# Patient Record
Sex: Male | Born: 1995
Health system: Southern US, Community
[De-identification: ages and names within clinical notes are randomized; demographics above are authoritative.]

## PROBLEM LIST (undated history)

## (undated) DIAGNOSIS — I319 Disease of pericardium, unspecified: Secondary | ICD-10-CM

## (undated) DIAGNOSIS — K219 Gastro-esophageal reflux disease without esophagitis: Secondary | ICD-10-CM

## (undated) DIAGNOSIS — R7989 Other specified abnormal findings of blood chemistry: Secondary | ICD-10-CM

## (undated) DIAGNOSIS — R945 Abnormal results of liver function studies: Secondary | ICD-10-CM

## (undated) HISTORY — DX: Gastro-esophageal reflux disease without esophagitis: K21.9

## (undated) HISTORY — PX: NO PAST SURGERIES: SHX2092

---
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
## 2019-10-25 ENCOUNTER — Encounter (HOSPITAL_COMMUNITY): Payer: Self-pay

## 2019-10-25 ENCOUNTER — Other Ambulatory Visit: Payer: Self-pay

## 2019-10-25 ENCOUNTER — Emergency Department (HOSPITAL_COMMUNITY)
Admission: EM | Admit: 2019-10-25 | Discharge: 2019-10-25 | Disposition: A | Discharge status: Home / Self Care | Payer: Self-pay | Attending: Emergency Medicine | Admitting: Emergency Medicine

## 2019-10-25 ENCOUNTER — Emergency Department (HOSPITAL_COMMUNITY): Payer: Self-pay

## 2019-10-25 DIAGNOSIS — R079 Chest pain, unspecified: Secondary | ICD-10-CM | POA: Insufficient documentation

## 2019-10-25 DIAGNOSIS — Z20822 Contact with and (suspected) exposure to covid-19: Secondary | ICD-10-CM | POA: Insufficient documentation

## 2019-10-25 DIAGNOSIS — R509 Fever, unspecified: Secondary | ICD-10-CM | POA: Insufficient documentation

## 2019-10-25 DIAGNOSIS — Z87891 Personal history of nicotine dependence: Secondary | ICD-10-CM | POA: Insufficient documentation

## 2019-10-25 DIAGNOSIS — B349 Viral infection, unspecified: Secondary | ICD-10-CM | POA: Insufficient documentation

## 2019-10-25 LAB — BASIC METABOLIC PANEL
Anion gap: 15 (ref 5–15)
BUN: 12 mg/dL (ref 6–20)
CO2: 24 mmol/L (ref 22–32)
Calcium: 10 mg/dL (ref 8.9–10.3)
Chloride: 96 mmol/L — ABNORMAL LOW (ref 98–111)
Creatinine, Ser: 0.98 mg/dL (ref 0.61–1.24)
GFR calc Af Amer: >60 mL/min (ref >60)
GFR calc non Af Amer: >60 mL/min (ref >60)
Glucose, Bld: 108 mg/dL — ABNORMAL HIGH (ref 70–99)
Potassium: 3.9 mmol/L (ref 3.5–5.1)
Sodium: 135 mmol/L (ref 135–145)

## 2019-10-25 LAB — CBC
HCT: 41.7 % (ref 39.0–52.0)
Hemoglobin: 14.2 g/dL (ref 13.0–17.0)
MCH: 27.9 pg (ref 26.0–34.0)
MCHC: 34.1 g/dL (ref 30.0–36.0)
MCV: 81.9 fL (ref 80.0–100.0)
Platelets: 160 10*3/uL (ref 150–400)
RBC: 5.09 MIL/uL (ref 4.22–5.81)
RDW: 12 % (ref 11.5–15.5)
WBC: 6.3 10*3/uL (ref 4.0–10.5)
nRBC: 0 % (ref 0.0–0.2)

## 2019-10-25 LAB — TROPONIN I (HIGH SENSITIVITY): Troponin I (High Sensitivity): 3 ng/L (ref <18)

## 2019-10-25 LAB — SARS CORONAVIRUS 2 BY RT PCR (HOSPITAL ORDER, PERFORMED IN ~~LOC~~ HOSPITAL LAB): SARS Coronavirus 2: NEGATIVE

## 2019-10-25 IMAGING — CR DG CHEST 2V
2 series · 2 of 2 positions shown · non-contrast
Comparison: None.

CLINICAL DATA: Chest pain for 2 months.  Fever.

EXAM:
CHEST - 2 VIEW

[w chest pa]
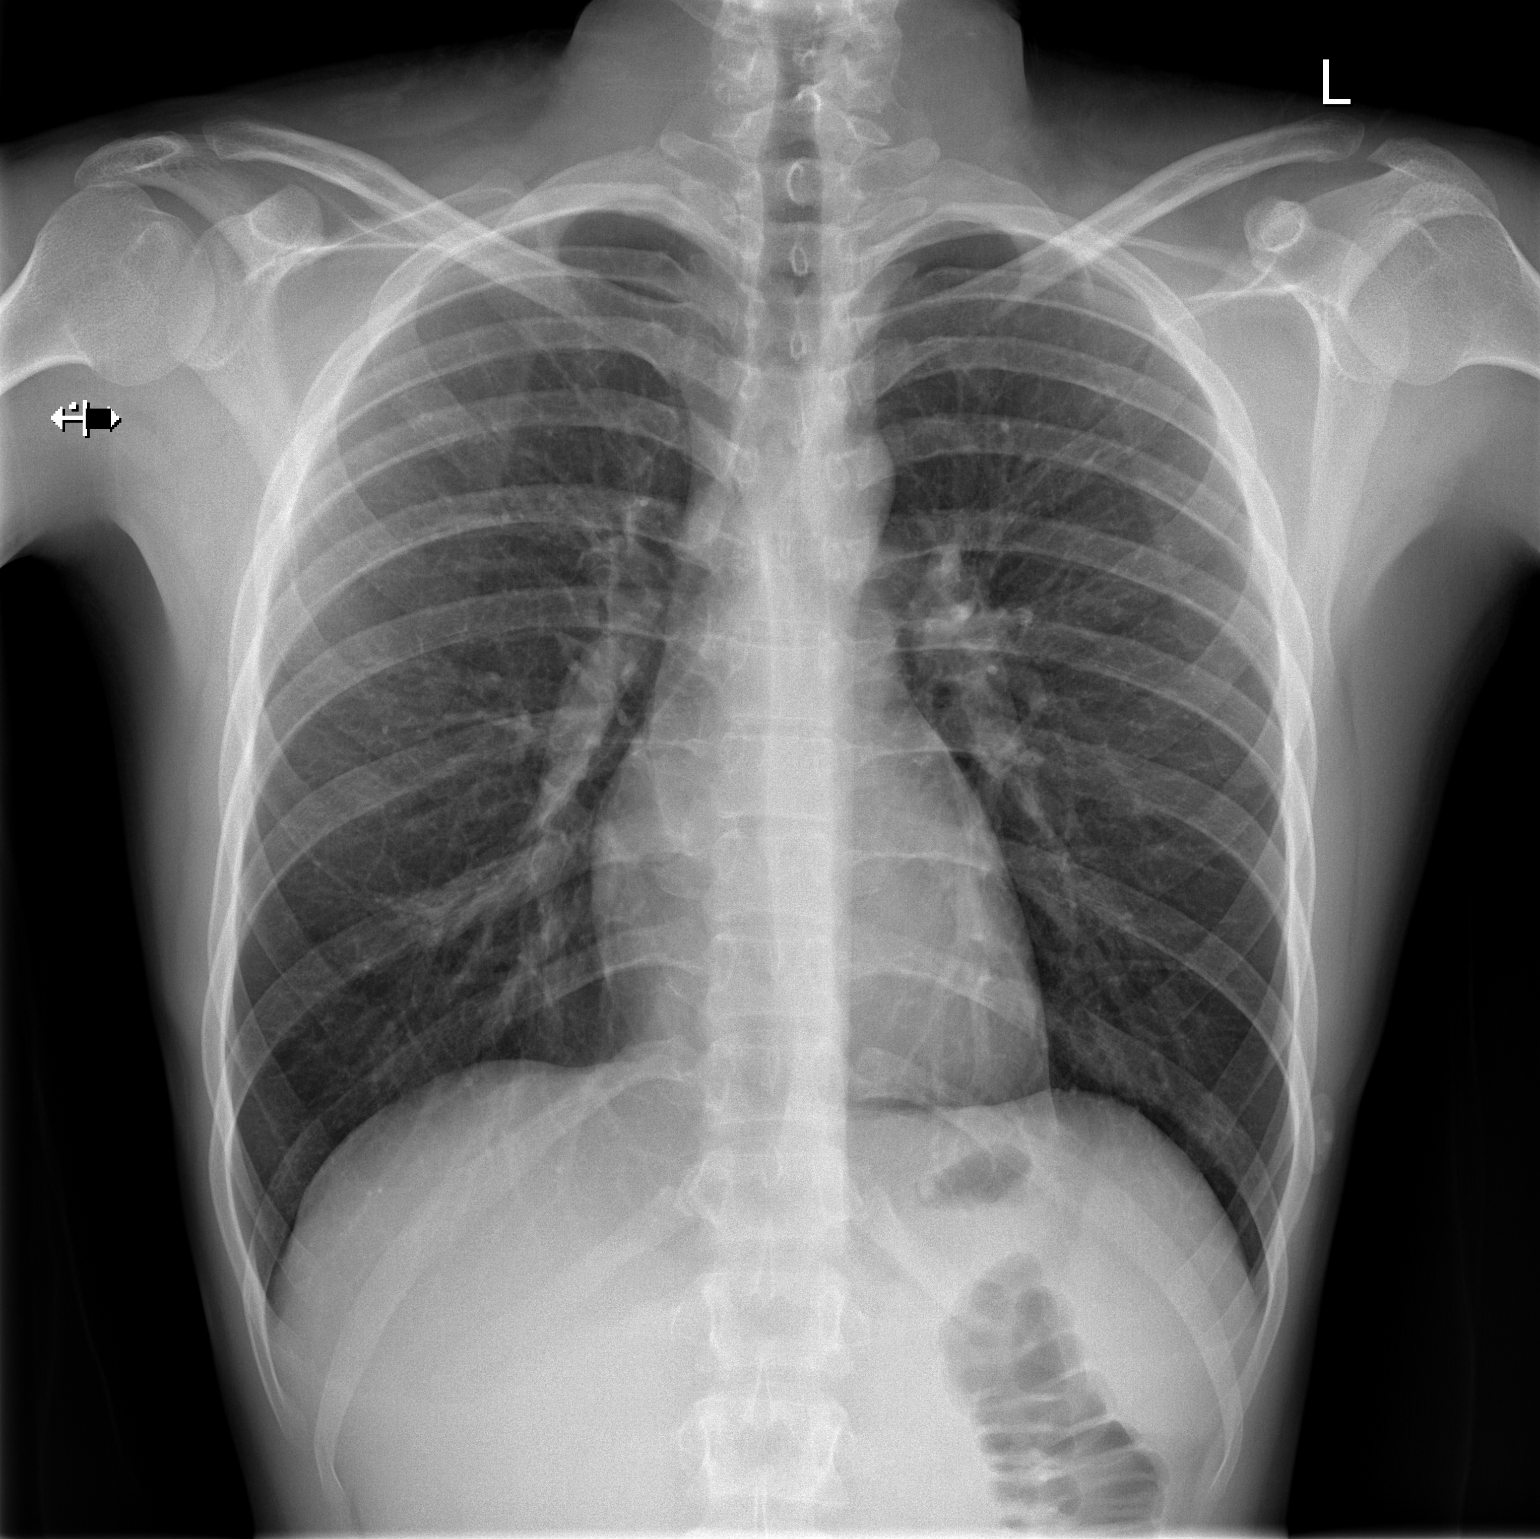

[w chest lat]
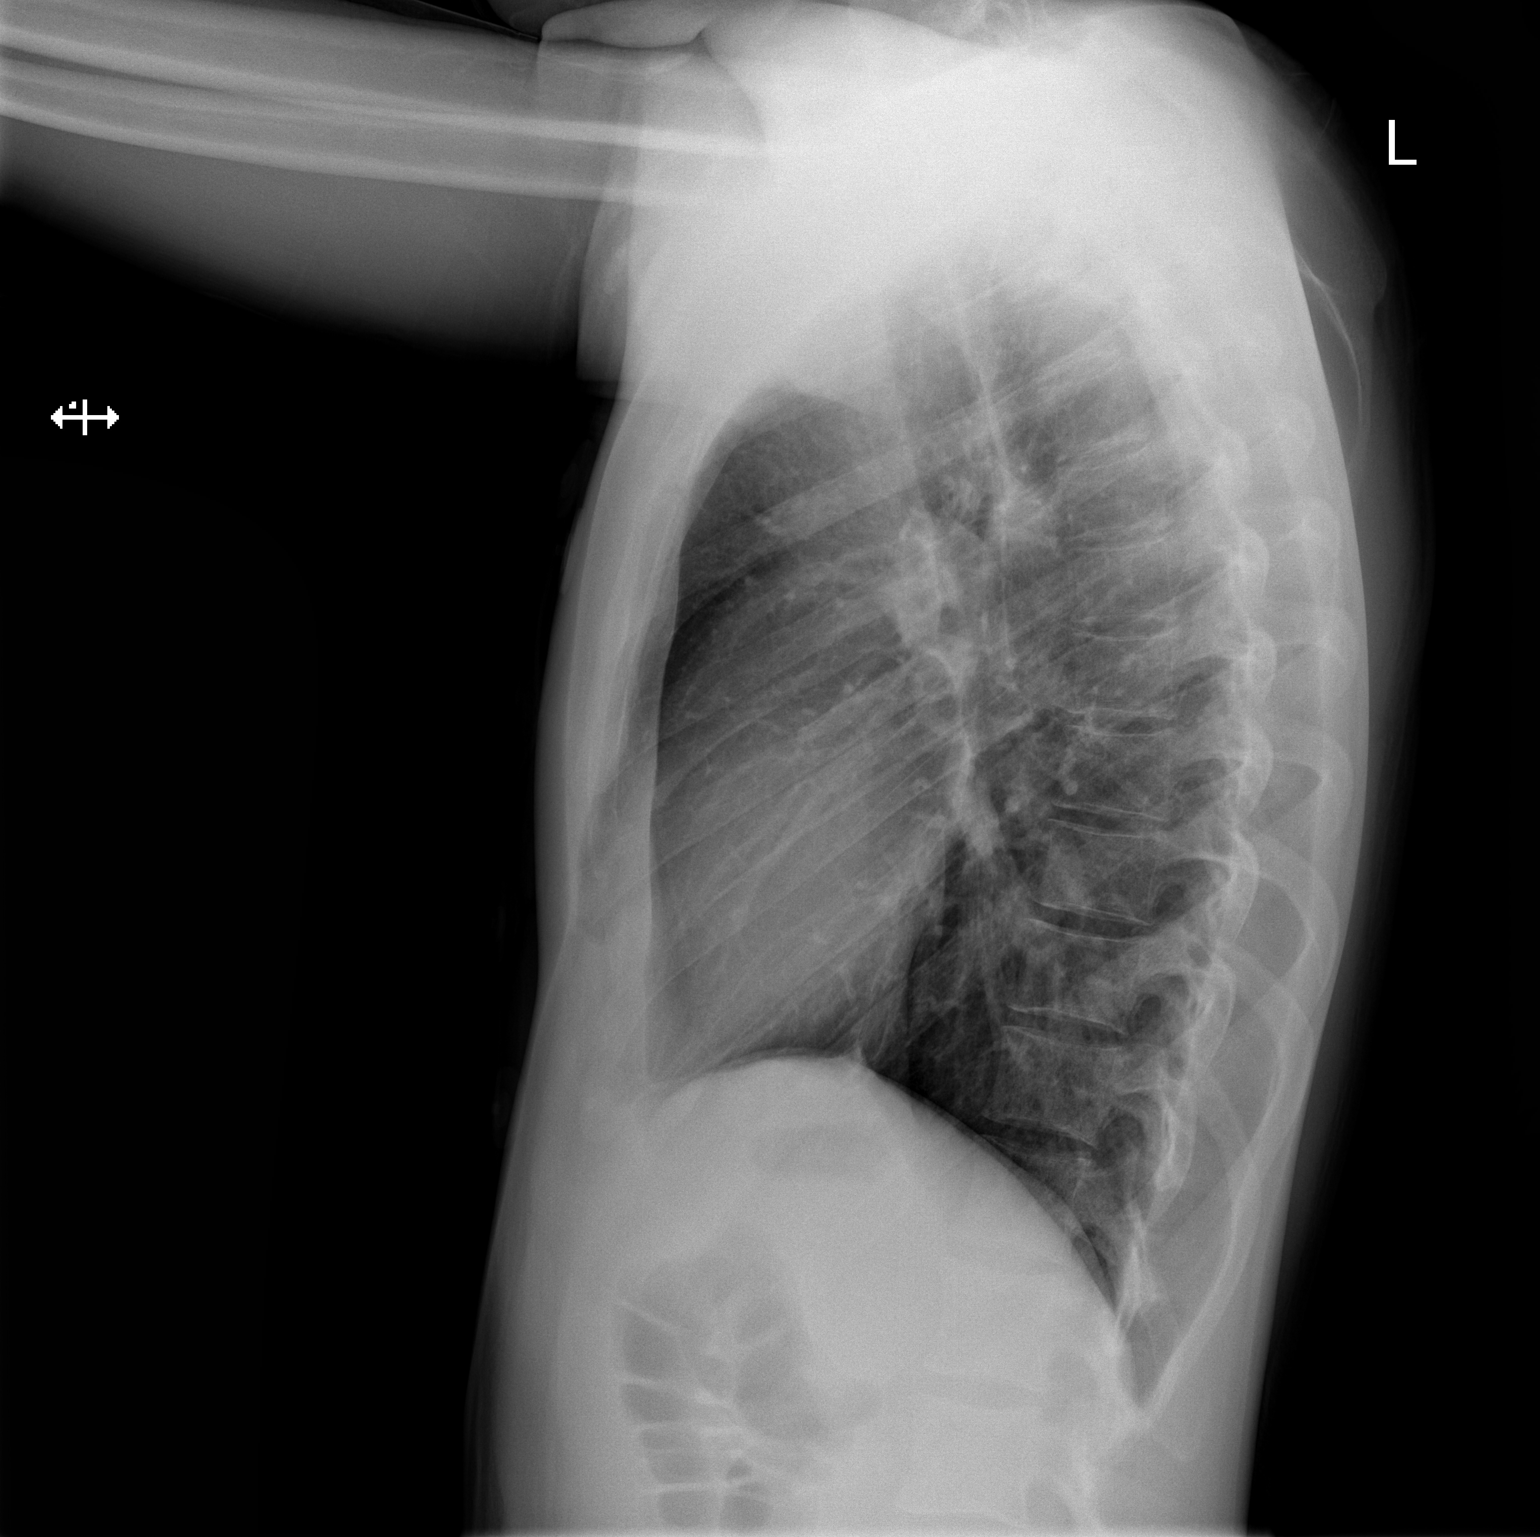

[2 of 2 positions shown; findings below may reference images not displayed]

FINDINGS: Midline trachea.  Normal heart size and mediastinal contours.

Sharp costophrenic angles.  No pneumothorax.  Clear lungs.
IMPRESSION: No active cardiopulmonary disease.

## 2019-10-25 MED ORDER — ACETAMINOPHEN 325 MG PO TABS
650.0000 mg | ORAL_TABLET | Freq: Once | ORAL | Status: AC | PRN
  Administered 2019-10-25: 650 mg via ORAL
  Filled 2019-10-25: qty 2

## 2019-10-25 NOTE — ED Triage Notes (Signed)
Patient reports having chest pain for a few months now. He states it comes and goes but today he reports a fever of 101.19F and is in a lot of pain that he describes as sharp. No nausea but reports diarrhea, weakness, headache.

## 2019-10-25 NOTE — ED Provider Notes (Signed)
[facility]EM[facility]GENCY DEPT Provider Note   CSN: [id number] Arrival date & time: 10/25/19  1127     History Chief Complaint  Patient presents with   Chest Pain    WILLIS HOLQUIN is a 24 y.o. male.  HPI  Patient is a 24 year old male with no pertinent past medical history presented today for 2 to 3 months of sternal chest pain he states that it is not pleuritic, exertional or worse with any particular position.  He denies any nausea, vomiting, diaphoresis.  He states that this is been ongoing and has not been worsening.  He denies any new or worsening anxiety or any particular stressors at work.  He states that over the past 2 or 3 days he has noticed new fevers and chills denies any other associated symptoms.  Patient states that he has had a negative Covid test recently.  He denies any sore throat, ear pain, abdominal pain.  He states that his chest pain actually resolved after the Tylenol he was given in triage.  His fevers have also abated  Patient states he has no complaints at this time is pain-free and without any myalgias or other symptoms.    History reviewed. No pertinent past medical history.  Patient Active Problem List   Diagnosis Date Noted   Chest pain of uncertain etiology 10/27/2019    History reviewed. No pertinent surgical history.     Family History  Problem Relation Age of Onset   Healthy Mother    Healthy Father     Social History   Tobacco Use   Smoking status: Former Smoker   Smokeless tobacco: Former Forensic psychologist Use: Never used  Substance Use Topics   Alcohol use: Not Currently   Drug use: Never    Home Medications Prior to Admission medications   Medication Sig Start Date End Date Taking? Authorizing Provider  acetaminophen (TYLENOL) 500 MG tablet Take 1,000 mg by mouth every 6 (six) hours as needed for moderate pain.    [provider]    Allergies    Patient has no known  allergies.  Review of Systems   Review of Systems  Constitutional: Positive for fever. Negative for chills.  HENT: Positive for congestion.   Eyes: Negative for pain.  Respiratory: Positive for cough. Negative for shortness of breath.   Cardiovascular: Positive for chest pain. Negative for leg swelling.  Gastrointestinal: Negative for abdominal pain, diarrhea, nausea and vomiting.  Genitourinary: Negative for dysuria.  Musculoskeletal: Negative for myalgias.  Skin: Negative for rash.  Neurological: Negative for dizziness and headaches.    Physical Exam Updated Vital Signs BP 114/76    Pulse 89    Temp 98.6 F (37 C)    Resp (!) 23    Ht 5\' 6"  (1.676 m)    Wt 59 kg    SpO2 98%    BMI 20.98 kg/m   Physical Exam Vitals and nursing note reviewed.  Constitutional:      General: He is not in acute distress. HENT:     Head: Normocephalic and atraumatic.     Nose: Nose normal.  Eyes:     General: No scleral icterus. Cardiovascular:     Rate and Rhythm: Normal rate and regular rhythm.     Pulses: Normal pulses.     Heart sounds: Normal heart sounds.     Comments: Pulses 3+ and symmetric in all 4 extremities. Heart rate is 90 Pulmonary:  Effort: Pulmonary effort is normal. No respiratory distress.     Breath sounds: No wheezing.     Comments: Lungs are clear to auscultation all fields.  Mild tachypnea with respiratory rate of 22. Abdominal:     Palpations: Abdomen is soft.     Tenderness: There is no abdominal tenderness. There is no guarding or rebound.  Musculoskeletal:     Cervical back: Normal range of motion.     Right lower leg: No edema.     Left lower leg: No edema.     Comments: Bilateral legs with no edema, calf tenderness, no swelling.  No tenderness to palpation.  Skin:    General: Skin is warm and dry.     Capillary Refill: Capillary refill takes less than 2 seconds.  Neurological:     Mental Status: He is alert. Mental status is at baseline.  Psychiatric:         Mood and Affect: Mood normal.        Behavior: Behavior normal.     ED Results / Procedures / Treatments   Labs (all labs ordered are listed, but only abnormal results are displayed) Labs Reviewed  BASIC METABOLIC PANEL - Abnormal; Notable for the following components:      Result Value   Chloride 96 (*)    Glucose, Bld 108 (*)    All other components within normal limits  SARS CORONAVIRUS 2 BY RT PCR [facility] ORDER, PERFORMED IN [facility])  CBC  TROPONIN I (HIGH SENSITIVITY)    EKG EKG Interpretation  Date/Time:  Saturday October 25 2019 11:40:40 EDT Ventricular Rate:  106 PR Interval:    QRS Duration: 81 QT Interval:  292 QTC Calculation: 388 R Axis:   91 Text Interpretation: Sinus tachycardia Borderline right axis deviation RSR' in V1 or V2, probably normal variant Borderline T wave abnormalities No acute changes No old tracing to compare Confirmed by Derwood Kaplan [phone]) on 10/25/2019 3:30:41 PM   Radiology DG Chest 2 View  Result Date: 10/27/2019 CLINICAL DATA:  Chest pain and shortness of breath. EXAM: CHEST - 2 VIEW COMPARISON:  October 25, 2019 FINDINGS: Cardiomediastinal silhouette is normal. Mediastinal contours appear intact. There is no evidence of focal airspace consolidation, pleural effusion or pneumothorax. Osseous structures are without acute abnormality. Soft tissues are grossly normal. IMPRESSION: No active cardiopulmonary disease. Electronically Signed   By: Ted Mcalpine M.D.   On: 10/27/2019 15:49   CT Angio Chest PE W and/or Wo Contrast  Result Date: 10/27/2019 CLINICAL DATA:  Chest pain, chest pressure EXAM: CT ANGIOGRAPHY CHEST WITH CONTRAST TECHNIQUE: Multidetector CT imaging of the chest was performed using the standard protocol during bolus administration of intravenous contrast. Multiplanar CT image reconstructions and MIPs were obtained to evaluate the vascular anatomy. CONTRAST:  27mL OMNIPAQUE IOHEXOL 350 MG/ML SOLN  COMPARISON:  None. FINDINGS: Cardiovascular: Satisfactory opacification of the pulmonary arteries to the segmental level. No evidence of pulmonary embolism. Normal heart size. No pericardial effusion. The thoracic aorta is unremarkable Mediastinum/Nodes: No enlarged mediastinal, hilar, or axillary lymph nodes. Thyroid gland, trachea, and esophagus demonstrate no significant findings. Residual thymic tissue seen within the anterior mediastinum. Lungs/Pleura: Lungs are clear. No pleural effusion or pneumothorax. Central airways are widely patent. Upper Abdomen: No acute abnormality. Musculoskeletal: No acute bone abnormality Review of the MIP images confirms the above findings. IMPRESSION: Negative for pulmonary embolism or other acute intrathoracic process. Electronically Signed   By: Helyn Numbers MD   On: 10/27/2019  17:55   MR CARDIAC MORPHOLOGY W WO CONTRAST  Result Date: 10/28/2019 CLINICAL DATA:  Myopericarditis EXAM: CARDIAC MRI TECHNIQUE: The patient was scanned on a 1.5 Tesla GE magnet. A dedicated cardiac coil was used. Functional imaging was done using Fiesta sequences. 2,3, and 4 chamber views were done to assess for RWMA's. Modified Simpson's rule using a short axis stack was used to calculate an ejection fraction on a dedicated work [employer]. The patient received 6 cc of Gadavist. After 10 minutes inversion recovery sequences were used to assess for infiltration and scar tissue. CONTRAST:  [id number] FINDINGS: Normal cardiac chamber sizes. Normal aortic root. Normal cardiac valves. Normal RV size and function. Normal LV size mild distal septal and apical hypokinesis quantitative EF 56% (EDV 115 cc ESV 51 cc SV 64 cc) Septal thickness 8 mm normal coronary artery origins no anomaly There is a trivial / small pericardial effusion along the RV free wall, apex and posterior LV. There is marked hyper-enhancement of the apical and RV free wall pericardium with a small area of myocardial  uptake int he distal septum and apex IMPRESSION: 1. Findings consistent with myopericarditis with trivial to small pericardial effusion. Marked hyper-enhancement of the apical and RV free wall pericardium with small amount of myocardial uptake in the distal septum and apex consistent with myopericarditis Area corresponds to RWMA with mild distal septal and apical hypokinesis 2. Overall preserved EF 56% with mild distal septal and apical hypokinesis 3.  Normal coronary artery origins no anomaly 4.  Normal RV size and function 5.  Normal cardiac valves 6.  Normal aortic root 7.  No PFO/ASD/VSD Charlton Haws Electronically Signed   By: Charlton Haws M.D.   On: 10/28/2019 08:51   ECHOCARDIOGRAM COMPLETE  Result Date: 10/28/2019    ECHOCARDIOGRAM REPORT   Patient Name:   EDNA GROVER Date of Exam: 10/28/2019 Medical Rec #:  [id number]        Height:       66.0 in Accession #:    [id number]       Weight:       126.8 lb Date of Birth:  1995/11/17        BSA:          1.648 m Patient Age:    24 years         BP:           117/59 mmHg Patient Gender: M                HR:           80 bpm. Exam Location:  Inpatient Procedure: 2D Echo, Cardiac Doppler and Color Doppler Indications:    Chest Pain 786.50 / R07.9  History:        Patient has no prior history of Echocardiogram examinations.                 Signs/Symptoms:Chest Pain.  Sonographer:    Eulah Pont RDCS Referring Phys: [id number] RHONDA G BARRETT IMPRESSIONS  1. Left ventricular ejection fraction, by estimation, is 55 to 60%. The left ventricle has normal function.  2. Right ventricular systolic function is normal. The right ventricular size is normal. Tricuspid regurgitation signal is inadequate for assessing PA pressure.  3. The mitral valve is normal in structure. Trivial mitral valve regurgitation. No evidence of mitral stenosis.  4. The aortic valve is tricuspid. Aortic valve regurgitation is not visualized. Mild aortic valve sclerosis is present, with no  evidence of aortic valve stenosis.  5. The inferior vena cava is normal in size with greater than 50% respiratory variability, suggesting right atrial pressure of 3 mmHg. FINDINGS  Left Ventricle: Left ventricular ejection fraction, by estimation, is 55 to 60%. The left ventricle has normal function. The left ventricle has no regional wall motion abnormalities. The left ventricular internal cavity size was normal in size. There is  no left ventricular hypertrophy. Left ventricular diastolic parameters were normal. Normal left ventricular filling pressure. Right Ventricle: The right ventricular size is normal. No increase in right ventricular wall thickness. Right ventricular systolic function is normal. Tricuspid regurgitation signal is inadequate for assessing PA pressure. Left Atrium: Left atrial size was normal in size. Right Atrium: Right atrial size was normal in size. Pericardium: There is no evidence of pericardial effusion. Mitral Valve: The mitral valve is normal in structure. Normal mobility of the mitral valve leaflets. Trivial mitral valve regurgitation. No evidence of mitral valve stenosis. Tricuspid Valve: The tricuspid valve is normal in structure. Tricuspid valve regurgitation is trivial. No evidence of tricuspid stenosis. Aortic Valve: The aortic valve is tricuspid. . There is mild thickening and mild calcification of the aortic valve. Aortic valve regurgitation is not visualized. Mild aortic valve sclerosis is present, with no evidence of aortic valve stenosis. There is mild thickening of the aortic valve. There is mild calcification of the aortic valve. Pulmonic Valve: The pulmonic valve was normal in structure. Pulmonic valve regurgitation is not visualized. No evidence of pulmonic stenosis. Aorta: The aortic root is normal in size and structure. Venous: The inferior vena cava is normal in size with greater than 50% respiratory variability, suggesting right atrial pressure of 3 mmHg. IAS/Shunts:  No atrial level shunt detected by color flow Doppler.  LEFT VENTRICLE PLAX 2D LVIDd:         4.00 cm  Diastology LVIDs:         2.90 cm  LV e' lateral:   10.60 cm/s LV PW:         0.90 cm  LV E/e' lateral: 5.7 LV IVS:        0.80 cm  LV e' medial:    10.90 cm/s LVOT diam:     2.10 cm  LV E/e' medial:  5.5 LV SV:         53 LV SV Index:   32 LVOT Area:     3.46 cm  RIGHT VENTRICLE RV S prime:     9.03 cm/s TAPSE (M-mode): 1.8 cm LEFT ATRIUM           Index       RIGHT ATRIUM          Index LA diam:      3.00 cm 1.82 cm/m  RA Area:     8.76 cm LA Vol (A2C): 30.9 ml 18.75 ml/m RA Volume:   18.80 ml 11.41 ml/m LA Vol (A4C): 18.7 ml 11.35 ml/m  AORTIC VALVE LVOT Vmax:   77.30 cm/s LVOT Vmean:  54.900 cm/s LVOT VTI:    0.152 m  AORTA Ao Root diam: 2.90 cm Ao Asc diam:  2.60 cm MITRAL VALVE MV Area (PHT): 5.13 cm    SHUNTS MV Decel Time: 148 msec    Systemic VTI:  0.15 m MV E velocity: 60.30 cm/s  Systemic Diam: 2.10 cm MV A velocity: 51.10 cm/s MV E/A ratio:  1.18 Armanda Magicraci Turner MD Electronically signed by Armanda Magicraci Turner MD Signature Date/Time: 10/28/2019/10:24:45 AM    Final  Procedures Procedures (including critical care time)  Medications Ordered in ED Medications  acetaminophen (TYLENOL) tablet 650 mg (650 mg Oral Given 10/25/19 1149)    ED Course  I have reviewed the triage vital signs and the nursing notes.  Pertinent labs & imaging results that were available during my care of the patient were reviewed by me and considered in my medical decision making (see chart for details).    MDM Rules/Calculators/A&P                          Patient is 24 year old male presented today with chest pain for 2 or 3 months. Please see HPI for full details.  Physical exam is unremarkable.  Patient vital signs are within normal limits after he received Tylenol.  He states he is chest pain-free now pain-free.  Patient is PERC negative doubt pulmonary embolism.  He does have a fever which heightens my concern  for myocarditis however his troponin X1 was within normal limits.  She decision-making physician patient he is agreeable to discharge at this time with conservative therapy and will treat his fever with Tylenol and will follow up with his primary care doctor.   CBC without leukocytosis or anemia.  BMP without any electrolyte abnormality.  Mildly low chloride however low suspicion for this being the cause of infection seen.  Troponin negative.  EKG reviewed by myself and Dr. Rhunette Croft.  No acute abnormalities.  No ST-T wave abnormalities. + Tachycardia.  He is chest pain-free this time repeat EKG no significant change.  Will discharge home with strict return precautions.  The emergent causes of chest pain include: Acute coronary syndrome, tamponade, pericarditis/myocarditis, aortic dissection, pulmonary embolism, tension pneumothorax, pneumonia, and esophageal rupture.  I do not believe the patient has an emergent cause of chest pain, other urgent/non-acute considerations include, but are not limited to: chronic angina, aortic stenosis, cardiomyopathy, mitral valve prolapse, pulmonary hypertension, aortic insufficiency, right ventricular hypertrophy, pleuritis, bronchitis, pneumothorax, tumor, gastroesophageal reflux disease (GERD), esophageal spasm, Mallory-Weiss syndrome, peptic ulcer disease, pancreatitis, functional gastrointestinal pain, cervical or thoracic disk disease or arthritis, shoulder arthritis, costochondritis, subacromial bursitis, anxiety or panic attack, herpes zoster, breast disorders, chest wall tumors, thoracic outlet syndrome, mediastinitis.  JASEAN AMBROSIA was evaluated in Emergency Department on 10/28/2019 for the symptoms described in the history of present illness. He was evaluated in the context of the global COVID-19 pandemic, which necessitated consideration that the patient might be at risk for infection with the SARS-CoV-2 virus that causes COVID-19. Institutional protocols and  algorithms that pertain to the evaluation of patients at risk for COVID-19 are in a state of rapid change based on information released by regulatory bodies including the CDC and federal and state organizations. These policies and algorithms were followed during the patient's care in the ED.  Final Clinical Impression(s) / ED Diagnoses Final diagnoses:  Chest pain, unspecified type  Fever, unspecified fever cause  Viral illness    Rx / DC Orders ED Discharge Orders    None       Gailen Shelter, [place] 10/28/19 1812    Derwood Kaplan, MD 11/17/19 1407

## 2019-10-25 NOTE — Discharge Instructions (Signed)
Your Covid test was negative.  Your cardiac enzymes were reassuring and your EKG appears normal. You may always return to the emergency department for any new or concerning symptoms however I suspect that you have a viral illness which is causing your symptoms.  Your chest pain may be related to the viral illness however this is a very serious symptom and I recommend following up closely with your primary care doctor to follow-up on this.  I given you the information for the [place] [facility] in case you do not have a primary care doctor already.  Please use Tylenol 1000 mg every 6 hours.  You may alternate with ibuprofen 600 mg.  Please drink plenty of water. Your Covid test was negative here today I do recommend getting vaccinated soon as you are able to.

## 2019-10-27 ENCOUNTER — Emergency Department (HOSPITAL_COMMUNITY): Payer: Self-pay

## 2019-10-27 ENCOUNTER — Inpatient Hospital Stay (HOSPITAL_COMMUNITY)
Admission: EM | Admit: 2019-10-27 | Discharge: 2019-10-29 | DRG: 315 | Disposition: A | Discharge status: Home / Self Care | Payer: Self-pay | Attending: Cardiology | Admitting: Cardiology

## 2019-10-27 ENCOUNTER — Encounter (HOSPITAL_COMMUNITY): Payer: Self-pay | Admitting: Emergency Medicine

## 2019-10-27 ENCOUNTER — Other Ambulatory Visit: Payer: Self-pay

## 2019-10-27 DIAGNOSIS — R7401 Elevation of levels of liver transaminase levels: Secondary | ICD-10-CM | POA: Diagnosis present

## 2019-10-27 DIAGNOSIS — Z87891 Personal history of nicotine dependence: Secondary | ICD-10-CM

## 2019-10-27 DIAGNOSIS — I409 Acute myocarditis, unspecified: Principal | ICD-10-CM | POA: Diagnosis present

## 2019-10-27 DIAGNOSIS — R739 Hyperglycemia, unspecified: Secondary | ICD-10-CM | POA: Diagnosis present

## 2019-10-27 DIAGNOSIS — D696 Thrombocytopenia, unspecified: Secondary | ICD-10-CM | POA: Diagnosis present

## 2019-10-27 DIAGNOSIS — Z20822 Contact with and (suspected) exposure to covid-19: Secondary | ICD-10-CM | POA: Diagnosis present

## 2019-10-27 DIAGNOSIS — R7989 Other specified abnormal findings of blood chemistry: Secondary | ICD-10-CM | POA: Diagnosis present

## 2019-10-27 DIAGNOSIS — R778 Other specified abnormalities of plasma proteins: Secondary | ICD-10-CM | POA: Diagnosis present

## 2019-10-27 DIAGNOSIS — I309 Acute pericarditis, unspecified: Secondary | ICD-10-CM | POA: Diagnosis present

## 2019-10-27 DIAGNOSIS — I319 Disease of pericardium, unspecified: Secondary | ICD-10-CM

## 2019-10-27 HISTORY — DX: Disease of pericardium, unspecified: I31.9

## 2019-10-27 HISTORY — DX: Abnormal results of liver function studies: R94.5

## 2019-10-27 HISTORY — DX: Other specified abnormal findings of blood chemistry: R79.89

## 2019-10-27 LAB — HEPATIC FUNCTION PANEL
ALT: 221 U/L — ABNORMAL HIGH (ref 0–44)
AST: 261 U/L — ABNORMAL HIGH (ref 15–41)
Albumin: 4.3 g/dL (ref 3.5–5.0)
Alkaline Phosphatase: 106 U/L (ref 38–126)
Bilirubin, Direct: 0.5 mg/dL — ABNORMAL HIGH (ref 0.0–0.2)
Indirect Bilirubin: 0.4 mg/dL (ref 0.3–0.9)
Total Bilirubin: 0.9 mg/dL (ref 0.3–1.2)
Total Protein: 8.6 g/dL — ABNORMAL HIGH (ref 6.5–8.1)

## 2019-10-27 LAB — SARS CORONAVIRUS 2 BY RT PCR (HOSPITAL ORDER, PERFORMED IN ~~LOC~~ HOSPITAL LAB): SARS Coronavirus 2: NEGATIVE

## 2019-10-27 LAB — CBC
HCT: 41.8 % (ref 39.0–52.0)
Hemoglobin: 14.4 g/dL (ref 13.0–17.0)
MCH: 28.2 pg (ref 26.0–34.0)
MCHC: 34.4 g/dL (ref 30.0–36.0)
MCV: 82 fL (ref 80.0–100.0)
Platelets: 135 10*3/uL — ABNORMAL LOW (ref 150–400)
RBC: 5.1 MIL/uL (ref 4.22–5.81)
RDW: 12.2 % (ref 11.5–15.5)
WBC: 4.5 10*3/uL (ref 4.0–10.5)
nRBC: 0 % (ref 0.0–0.2)

## 2019-10-27 LAB — BASIC METABOLIC PANEL
Anion gap: 12 (ref 5–15)
BUN: 12 mg/dL (ref 6–20)
CO2: 28 mmol/L (ref 22–32)
Calcium: 9.5 mg/dL (ref 8.9–10.3)
Chloride: 95 mmol/L — ABNORMAL LOW (ref 98–111)
Creatinine, Ser: 0.86 mg/dL (ref 0.61–1.24)
GFR calc Af Amer: >60 mL/min (ref >60)
GFR calc non Af Amer: >60 mL/min (ref >60)
Glucose, Bld: 102 mg/dL — ABNORMAL HIGH (ref 70–99)
Potassium: 3.5 mmol/L (ref 3.5–5.1)
Sodium: 135 mmol/L (ref 135–145)

## 2019-10-27 LAB — RAPID URINE DRUG SCREEN, HOSP PERFORMED
Amphetamines: NOT DETECTED
Barbiturates: NOT DETECTED
Benzodiazepines: NOT DETECTED
Cocaine: NOT DETECTED
Opiates: NOT DETECTED
Tetrahydrocannabinol: NOT DETECTED

## 2019-10-27 LAB — TROPONIN I (HIGH SENSITIVITY)
Troponin I (High Sensitivity): 3688 ng/L (ref <18)
Troponin I (High Sensitivity): 4574 ng/L (ref <18)

## 2019-10-27 LAB — LIPASE, BLOOD: Lipase: 23 U/L (ref 11–51)

## 2019-10-27 LAB — D-DIMER, QUANTITATIVE: D-Dimer, Quant: 2.05 ug/mL-FEU — ABNORMAL HIGH (ref 0.00–0.50)

## 2019-10-27 LAB — C-REACTIVE PROTEIN: CRP: 4.5 mg/dL — ABNORMAL HIGH (ref <1.0)

## 2019-10-27 LAB — BRAIN NATRIURETIC PEPTIDE: B Natriuretic Peptide: 18.2 pg/mL (ref 0.0–100.0)

## 2019-10-27 IMAGING — CT CT ANGIO CHEST
2 of 6 series · 18 of 36 positions shown · IV contrast (omnipaque)
Comparison: None.

CLINICAL DATA: Chest pain, chest pressure

EXAM:
CT ANGIOGRAPHY CHEST WITH CONTRAST
TECHNIQUE: Multidetector CT imaging of the chest was performed using the
standard protocol during bolus administration of intravenous
contrast. Multiplanar CT image reconstructions and MIPs were
obtained to evaluate the vascular anatomy.
CONTRAST:  80mL OMNIPAQUE IOHEXOL 350 MG/ML SOLN

[Series 5: thins · axial · 0.62mm/px · z∈[+1366,+1622]mm · 17 of 288 slices shown]
[im 16/288  lung]
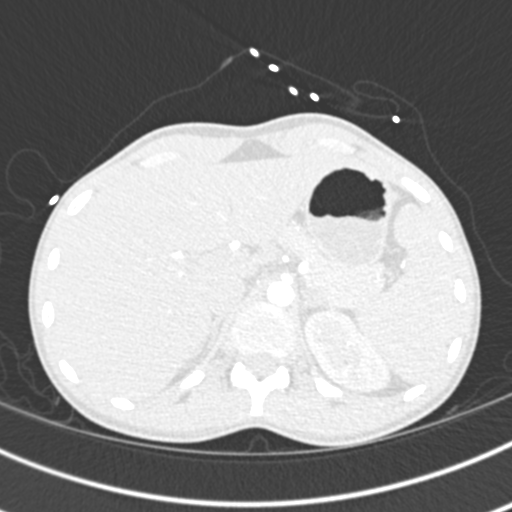
[im 32/288  mediastinal]
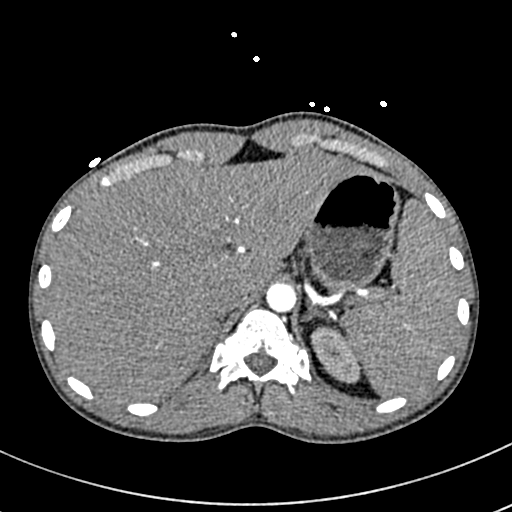
[im 48/288  lung]
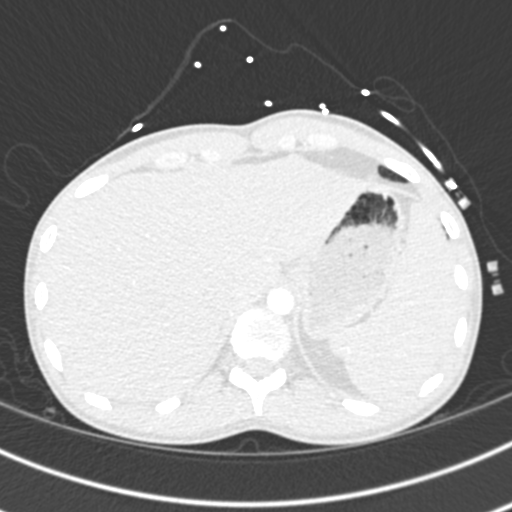
[im 64/288  mediastinal]
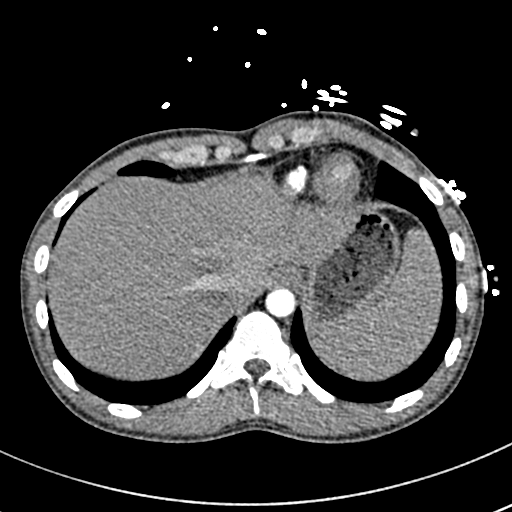
[im 80/288  lung]
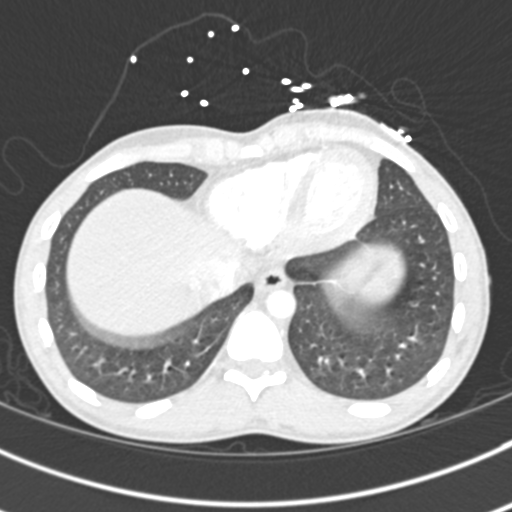
[im 96/288  mediastinal]
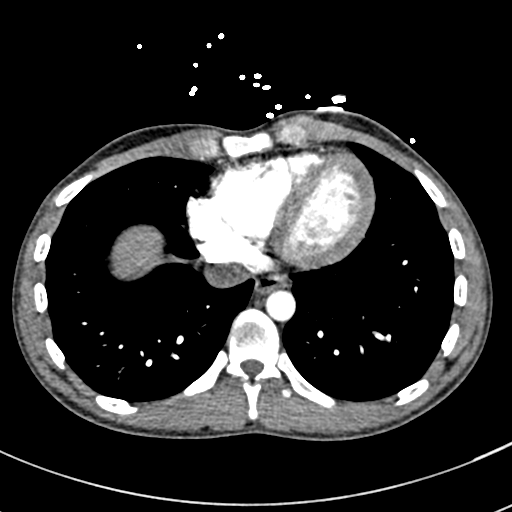
[im 112/288  lung]
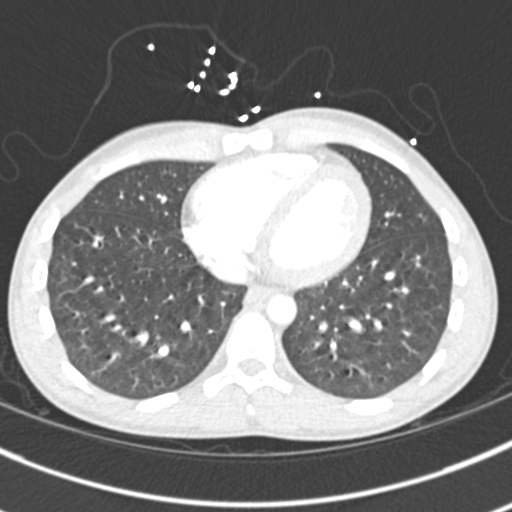
[im 128/288  mediastinal]
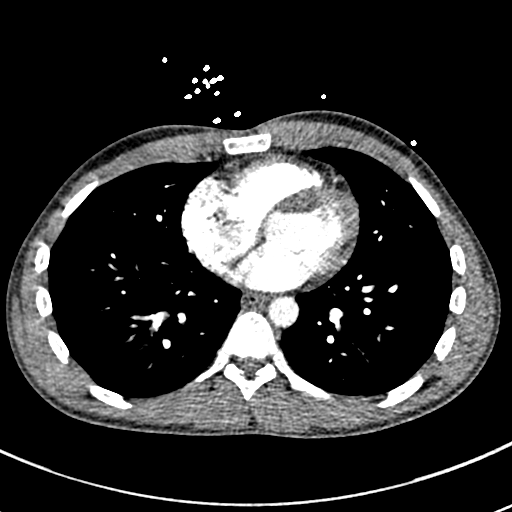
[im 144/288  lung]
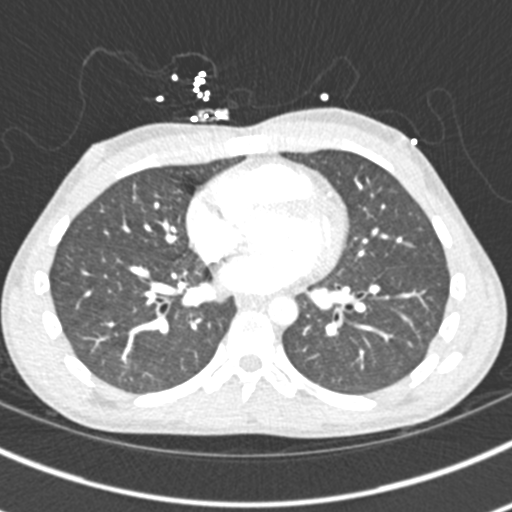
[im 160/288  mediastinal]
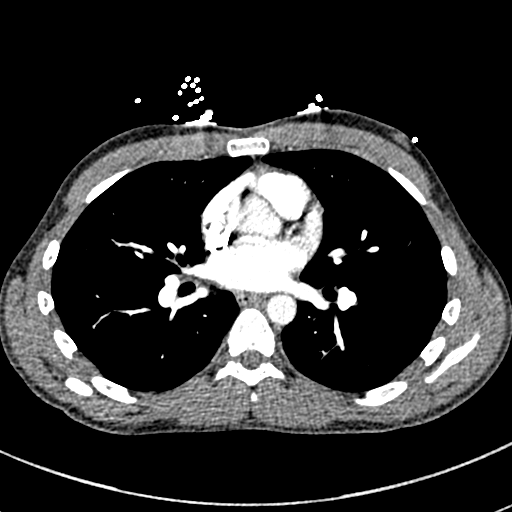
[im 176/288  lung]
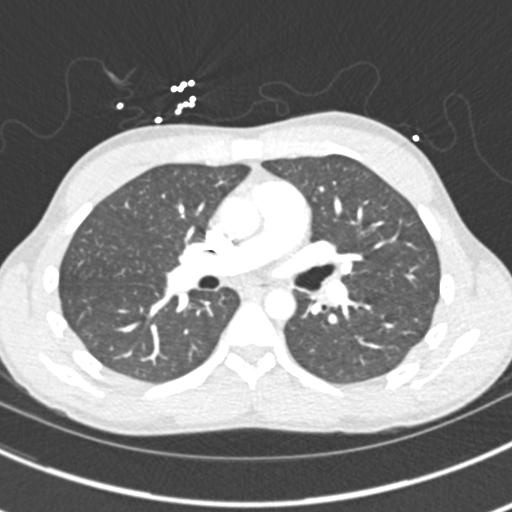
[im 192/288  mediastinal]
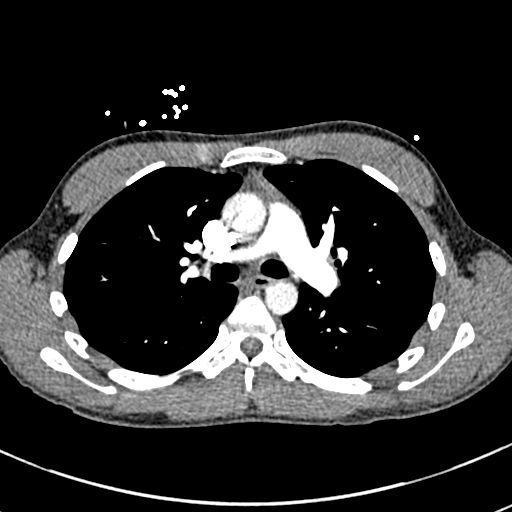
[im 208/288  lung]
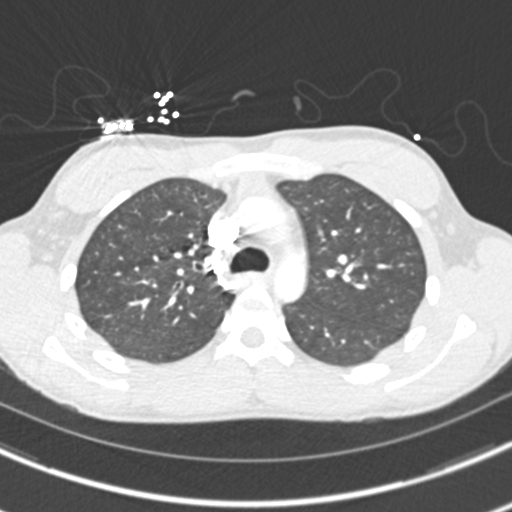
[im 224/288  mediastinal]
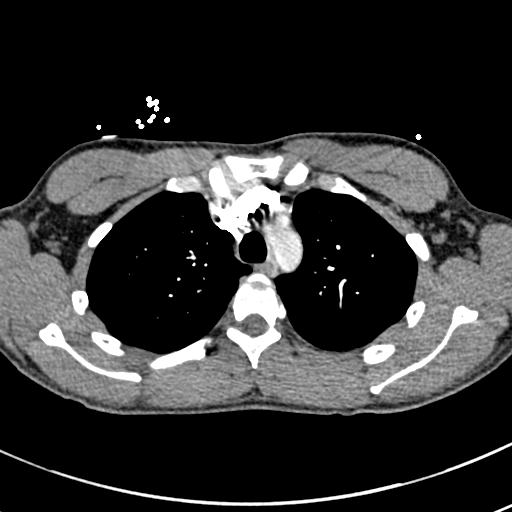
[im 240/288  lung]
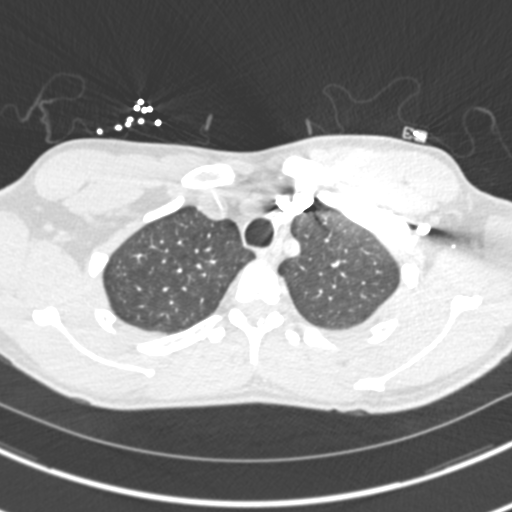
[im 256/288  mediastinal]
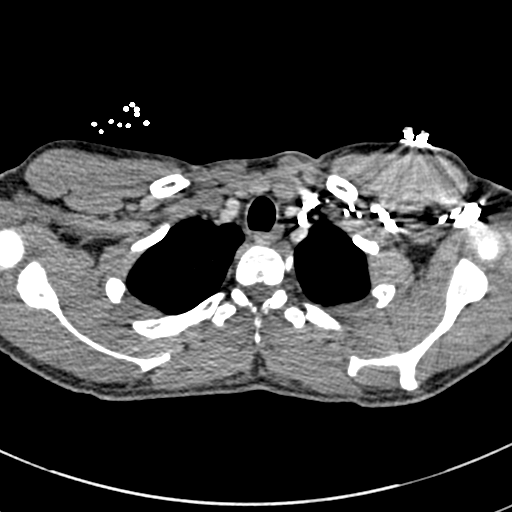
[im 272/288  lung]
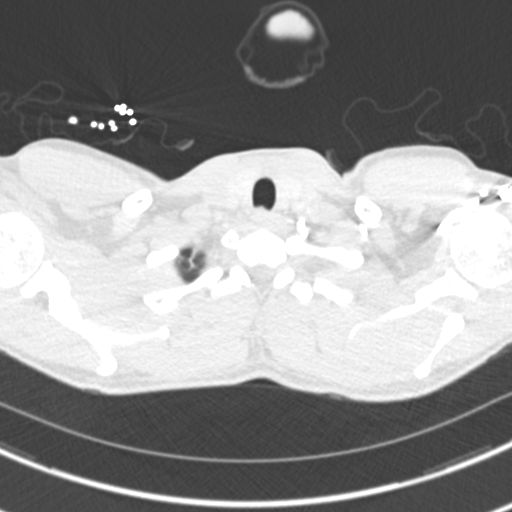

[Series 7: coronal mpr · coronal · 0.57mm/px · 1 of 97 slices shown]
[im 49/97  mediastinal]
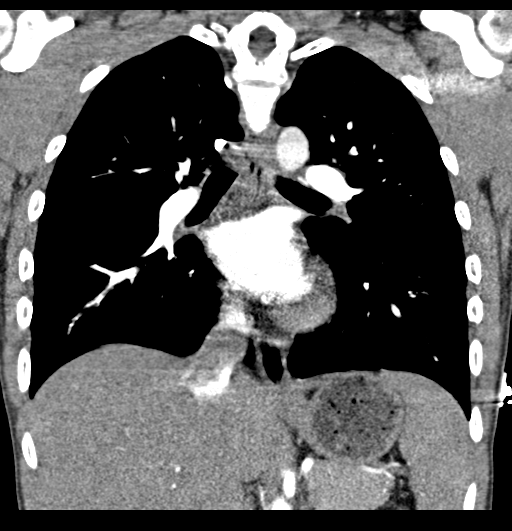

[18 of 36 positions shown; findings below may reference images not displayed]

FINDINGS: Cardiovascular: Satisfactory opacification of the pulmonary arteries
to the segmental level. No evidence of pulmonary embolism. Normal
heart size. No pericardial effusion. The thoracic aorta is
unremarkable

Mediastinum/Nodes: No enlarged mediastinal, hilar, or axillary lymph
nodes. Thyroid gland, trachea, and esophagus demonstrate no
significant findings. Residual thymic tissue seen within the
anterior mediastinum.

Lungs/Pleura: Lungs are clear. No pleural effusion or pneumothorax.
Central airways are widely patent.

Upper Abdomen: No acute abnormality.

Musculoskeletal: No acute bone abnormality

Review of the MIP images confirms the above findings.
IMPRESSION: Negative for pulmonary embolism or other acute intrathoracic
process.

## 2019-10-27 IMAGING — CR DG CHEST 2V
2 series · 2 of 2 positions shown · non-contrast
Comparison: [DATE]

CLINICAL DATA: Chest pain and shortness of breath.

EXAM:
CHEST - 2 VIEW

[w chest pa]
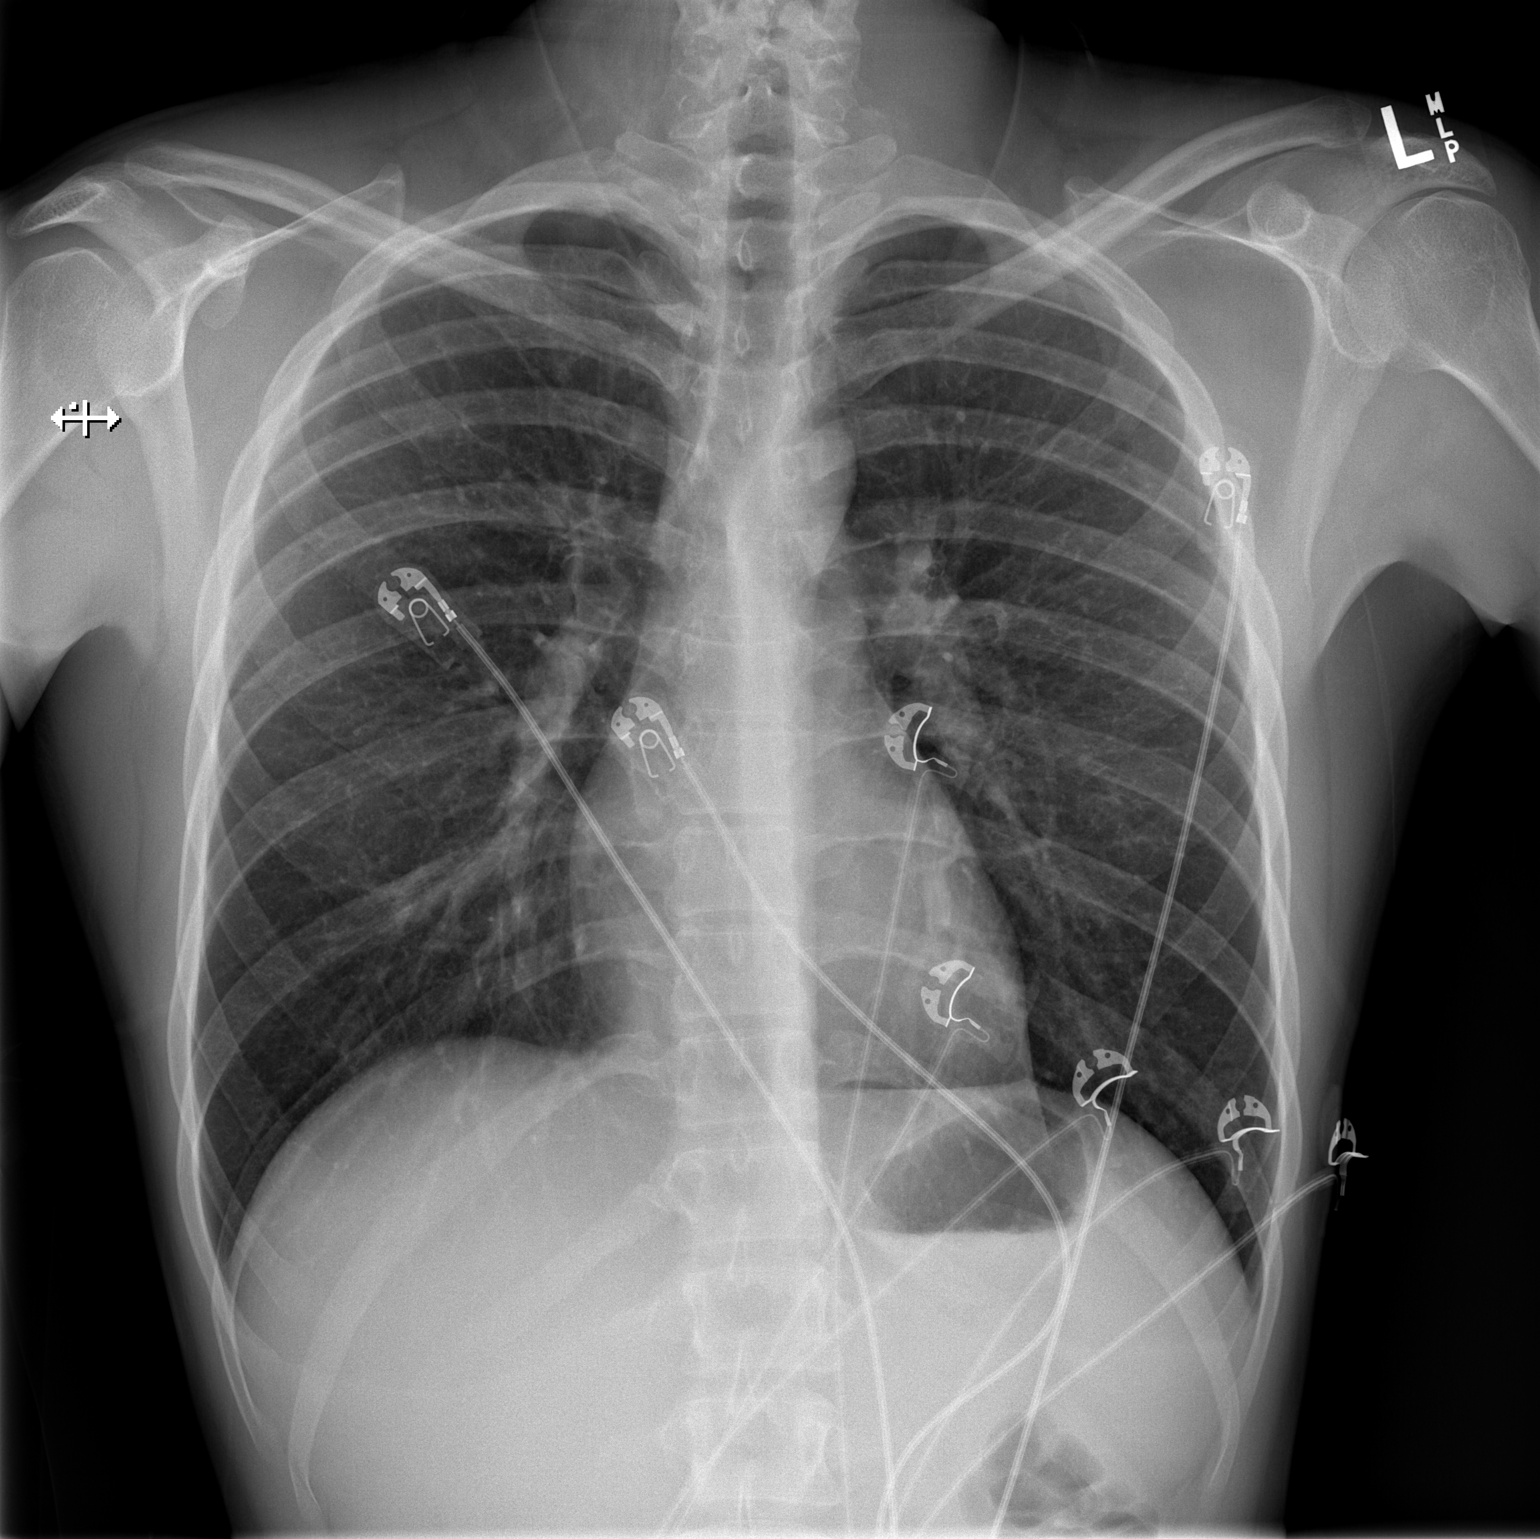

[w chest lat]
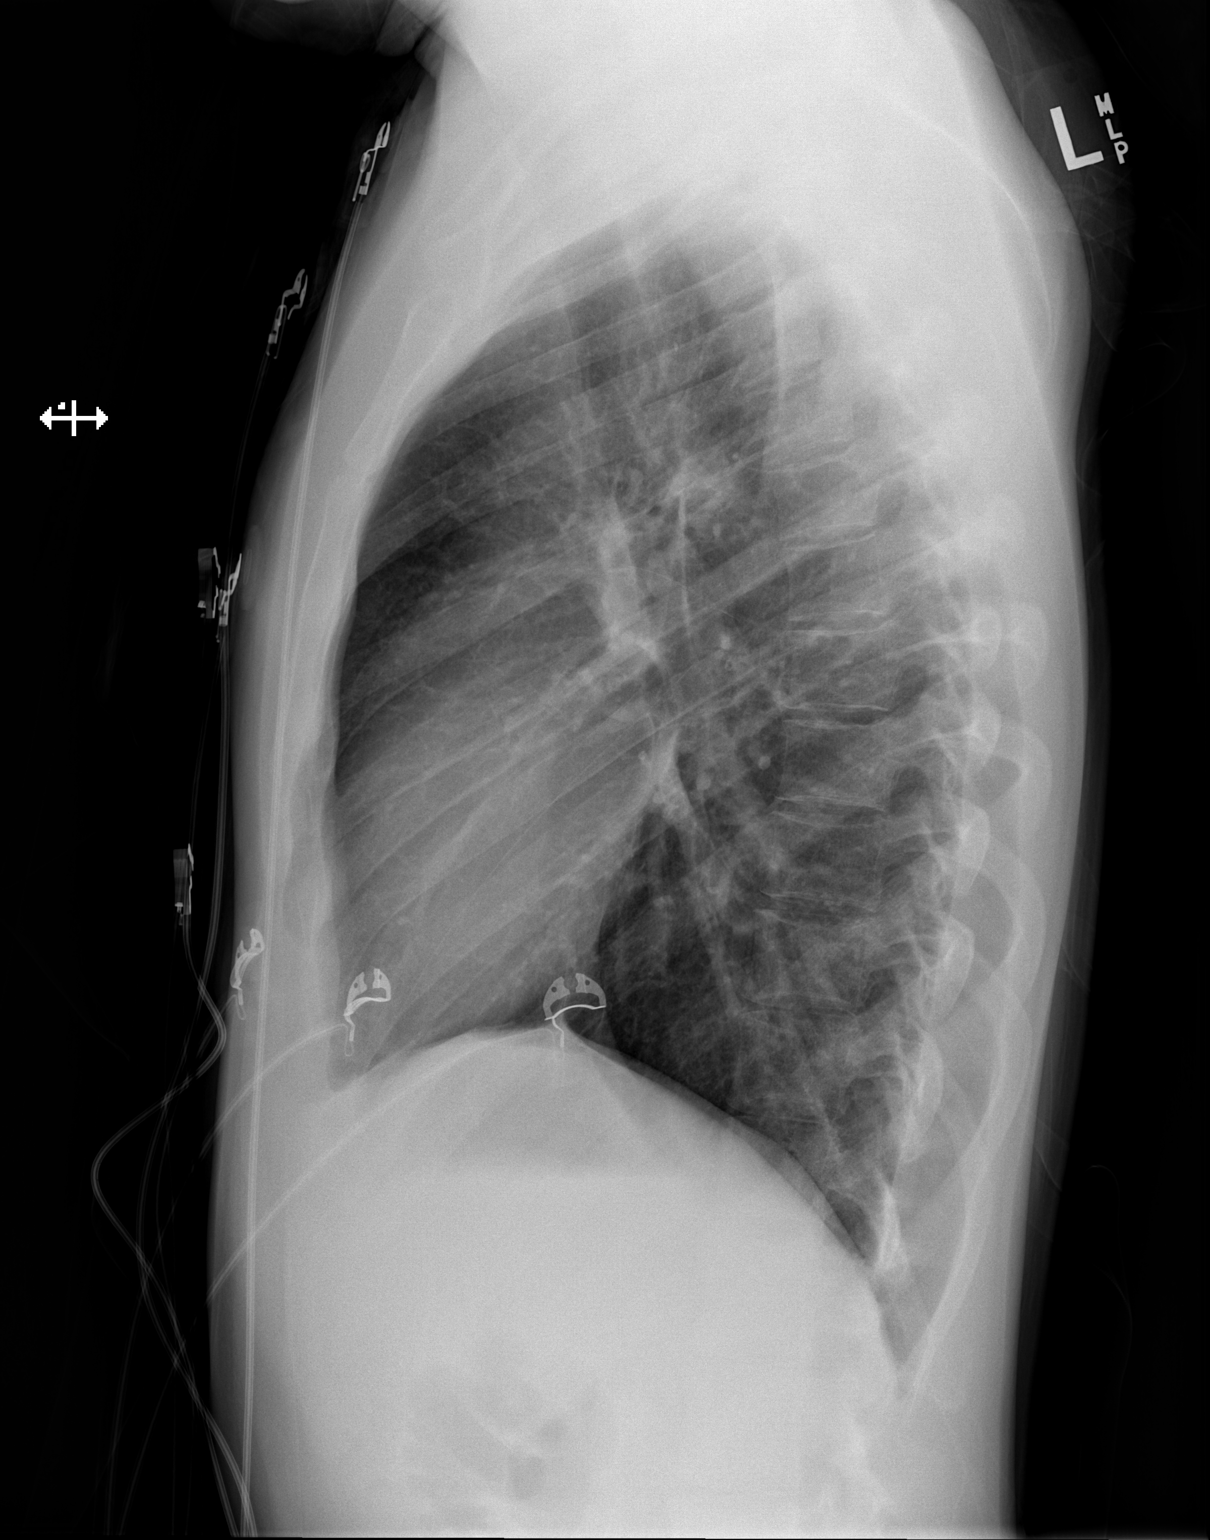

[2 of 2 positions shown; findings below may reference images not displayed]

FINDINGS: Cardiomediastinal silhouette is normal. Mediastinal contours appear
intact.

There is no evidence of focal airspace consolidation, pleural
effusion or pneumothorax.

Osseous structures are without acute abnormality. Soft tissues are
grossly normal.
IMPRESSION: No active cardiopulmonary disease.

## 2019-10-27 MED ORDER — KETOROLAC TROMETHAMINE 15 MG/ML IJ SOLN
15.0000 mg | Freq: Once | INTRAMUSCULAR | Status: AC
  Administered 2019-10-27: 15 mg via INTRAVENOUS
  Filled 2019-10-27: qty 1

## 2019-10-27 MED ORDER — SODIUM CHLORIDE 0.9% FLUSH: 3.0000 mL | INTRAVENOUS | Status: DC | PRN

## 2019-10-27 MED ORDER — SODIUM CHLORIDE 0.9 % IV SOLN: 250.0000 mL | INTRAVENOUS | Status: DC | PRN

## 2019-10-27 MED ORDER — ACETAMINOPHEN 325 MG PO TABS: 650.0000 mg | ORAL_TABLET | ORAL | Status: DC | PRN

## 2019-10-27 MED ORDER — PANTOPRAZOLE SODIUM 40 MG PO TBEC
40.0000 mg | DELAYED_RELEASE_TABLET | Freq: Every day | ORAL | Status: DC
  Administered 2019-10-28 – 2019-10-29 (×2): 40 mg via ORAL
  Filled 2019-10-27 (×2): qty 1

## 2019-10-27 MED ORDER — COLCHICINE 0.6 MG PO TABS
0.6000 mg | ORAL_TABLET | Freq: Every day | ORAL | Status: DC
  Administered 2019-10-27 – 2019-10-29 (×3): 0.6 mg via ORAL
  Filled 2019-10-27 (×3): qty 1

## 2019-10-27 MED ORDER — HEPARIN SODIUM (PORCINE) 5000 UNIT/ML IJ SOLN
5000.0000 [IU] | Freq: Three times a day (TID) | INTRAMUSCULAR | Status: DC
  Administered 2019-10-28 (×3): 5000 [IU] via SUBCUTANEOUS
  Filled 2019-10-27 (×4): qty 1

## 2019-10-27 MED ORDER — IBUPROFEN 600 MG PO TABS
800.0000 mg | ORAL_TABLET | Freq: Three times a day (TID) | ORAL | Status: DC
  Administered 2019-10-27 – 2019-10-29 (×5): 800 mg via ORAL
  Filled 2019-10-27 (×5): qty 1

## 2019-10-27 MED ORDER — ONDANSETRON HCL 4 MG/2ML IJ SOLN: 4.0000 mg | Freq: Four times a day (QID) | INTRAMUSCULAR | Status: DC | PRN

## 2019-10-27 MED ORDER — NITROGLYCERIN 0.4 MG SL SUBL
0.4000 mg | SUBLINGUAL_TABLET | SUBLINGUAL | Status: DC | PRN
  Administered 2019-10-28: 0.4 mg via SUBLINGUAL
  Filled 2019-10-27: qty 1

## 2019-10-27 MED ORDER — IOHEXOL 350 MG/ML SOLN
100.0000 mL | Freq: Once | INTRAVENOUS | Status: AC | PRN
  Administered 2019-10-27: 80 mL via INTRAVENOUS

## 2019-10-27 MED ORDER — ALPRAZOLAM 0.25 MG PO TABS: 0.2500 mg | ORAL_TABLET | Freq: Two times a day (BID) | ORAL | Status: DC | PRN

## 2019-10-27 MED ORDER — SODIUM CHLORIDE 0.9% FLUSH
3.0000 mL | Freq: Two times a day (BID) | INTRAVENOUS | Status: DC
  Administered 2019-10-28 – 2019-10-29 (×3): 3 mL via INTRAVENOUS

## 2019-10-27 MED ORDER — ZOLPIDEM TARTRATE 5 MG PO TABS: 5.0000 mg | ORAL_TABLET | Freq: Every evening | ORAL | Status: DC | PRN

## 2019-10-27 NOTE — Procedures (Signed)
Echo attempted in [facility] ED. Nurse stated patient would be transferred to [facility] ED imminently. Will attempt once patient is transferred to [facility] ED.

## 2019-10-27 NOTE — ED Notes (Addendum)
Pt transferred w/ [facility], is having 3/10 "constantly moving chest pain... a weird feeling in my neck."  Dr. Excell Seltzer from [facility] states it is not a STEMI, instead they are treating him for acute myopericarditis. D.dimer is positive, trop is rising from 3600 to over 4000.  Mom is in route.  118/74 86HR 96% RA 20RR  20G L AC

## 2019-10-27 NOTE — ED Notes (Signed)
Dr Mayford Knife would like for pt to transfer to [facility] ED. Spoke with CN who will call back regarding transfer.

## 2019-10-27 NOTE — ED Notes (Signed)
[facility] picking up patient and taking him to [facility] ED. Nurse talked to charged about transfer.

## 2019-10-27 NOTE — ED Provider Notes (Addendum)
Richard Richmond   CSN: [id number] Arrival date & time: 10/27/19  1513     History Chief Complaint  Patient presents with  . Chest Pain    Richard Richmond is a 24 y.o. male.  The history is provided by the patient.  Chest Pain Pain location:  Substernal area Pain quality: aching   Pain severity:  Mild Onset quality:  Gradual Timing:  Intermittent Progression:  Waxing and waning Chronicity:  New Context: at rest   Relieved by:  Leaning forward Exacerbated by: leaning back. Associated symptoms: fever and shortness of breath   Associated symptoms: no abdominal pain, no back pain, no cough, no palpitations and no vomiting   Risk factors: no coronary artery disease, no high cholesterol and no hypertension        History reviewed. No pertinent past medical history.  There are no problems to display for this patient.   History reviewed. No pertinent surgical history.     Family History  Problem Relation Age of Onset  . Healthy Mother   . Healthy Father     Social History   Tobacco Use  . Smoking status: Former Games developer  . Smokeless tobacco: Former Engineer, water Use Topics  . Alcohol use: Not Currently  . Drug use: Never    Home Medications Prior to Admission medications   Medication Sig Start Date End Date Taking? Authorizing Provider  acetaminophen (TYLENOL) 500 MG tablet Take 1,000 mg by mouth every 6 (six) hours as needed for moderate pain.   Yes [provider]    Allergies    Patient has no known allergies.  Review of Systems   Review of Systems  Constitutional: Positive for fever. Negative for chills.  HENT: Negative for ear pain and sore throat.   Eyes: Negative for pain and visual disturbance.  Respiratory: Positive for shortness of breath. Negative for cough.   Cardiovascular: Positive for chest pain. Negative for palpitations.  Gastrointestinal: Negative for abdominal pain and vomiting.   Genitourinary: Negative for dysuria and hematuria.  Musculoskeletal: Negative for arthralgias and back pain.  Skin: Negative for color change and rash.  Neurological: Negative for seizures and syncope.  All other systems reviewed and are negative.   Physical Exam Updated Vital Signs  ED Triage Vitals  Enc Vitals Group     BP 10/27/19 1530 103/77     Pulse Rate 10/27/19 1530 88     Resp 10/27/19 1530 15     Temp 10/27/19 1530 98.3 F (36.8 C)     Temp Source 10/27/19 1530 Oral     SpO2 10/27/19 1530 99 %     Weight 10/27/19 1534 160 lb (72.6 kg)     Height 10/27/19 1534 5\' 6"  (1.676 m)     Head Circumference --      Peak Flow --      Pain Score 10/27/19 1534 7     Pain Loc --      Pain Edu? --      Excl. in GC? --     Physical Exam Vitals and nursing Richmond reviewed.  Constitutional:      General: He is not in acute distress.    Appearance: He is well-developed.  HENT:     Head: Normocephalic and atraumatic.  Eyes:     Conjunctiva/sclera: Conjunctivae normal.     Pupils: Pupils are equal, round, and reactive to light.  Cardiovascular:     Rate and Rhythm: Normal  rate and regular rhythm.     Pulses:          Radial pulses are 2+ on the right side and 2+ on the left side.     Heart sounds: Normal heart sounds. No murmur heard.   Pulmonary:     Effort: Pulmonary effort is normal. No respiratory distress.     Breath sounds: Normal breath sounds. No decreased breath sounds.  Abdominal:     Palpations: Abdomen is soft.     Tenderness: There is no abdominal tenderness.  Musculoskeletal:        General: Normal range of motion.     Cervical back: Normal range of motion and neck supple.     Right lower leg: No edema.  Skin:    General: Skin is warm and dry.     Capillary Refill: Capillary refill takes less than 2 seconds.  Neurological:     General: No focal deficit present.     Mental Status: He is alert.  Psychiatric:        Mood and Affect: Mood normal.      ED Results / Procedures / Treatments   Labs (all labs ordered are listed, but only abnormal results are displayed) Labs Reviewed  BASIC METABOLIC PANEL - Abnormal; Notable for the following components:      Result Value   Chloride 95 (*)    Glucose, Bld 102 (*)    All other components within normal limits  CBC - Abnormal; Notable for the following components:   Platelets 135 (*)    All other components within normal limits  HEPATIC FUNCTION PANEL - Abnormal; Notable for the following components:   Total Protein 8.6 (*)    AST 261 (*)    ALT 221 (*)    Bilirubin, Direct 0.5 (*)    All other components within normal limits  D-DIMER, QUANTITATIVE (NOT AT [facility]) - Abnormal; Notable for the following components:   D-Dimer, Quant 2.05 (*)    All other components within normal limits  TROPONIN I (HIGH SENSITIVITY) - Abnormal; Notable for the following components:   Troponin I (High Sensitivity) 3,688 (*)    All other components within normal limits  SARS CORONAVIRUS 2 BY RT PCR (HOSPITAL ORDER, PERFORMED IN [facility])  CULTURE, BLOOD (ROUTINE X 2)  CULTURE, BLOOD (ROUTINE X 2)  LIPASE, BLOOD  BRAIN NATRIURETIC PEPTIDE  RAPID URINE DRUG SCREEN, HOSP PERFORMED  TROPONIN I (HIGH SENSITIVITY)    EKG EKG Interpretation  Date/Time:  Monday October 27 2019 15:29:04 EDT Ventricular Rate:  90 PR Interval:    QRS Duration: 82 QT Interval:  349 QTC Calculation: 427 R Axis:   86 Text Interpretation: Sinus rhythm ST elevation inferior/lateral with pr depression diffusely Confirmed by Virgina NorfolkAdam, Shalaina Guardiola [phone]([id number]) on 10/27/2019 5:06:18 PM   Radiology DG Chest 2 View  Result Date: 10/27/2019 CLINICAL DATA:  Chest pain and shortness of breath. EXAM: CHEST - 2 VIEW COMPARISON:  October 25, 2019 FINDINGS: Cardiomediastinal silhouette is normal. Mediastinal contours appear intact. There is no evidence of focal airspace consolidation, pleural effusion or pneumothorax. Osseous  structures are without acute abnormality. Soft tissues are grossly normal. IMPRESSION: No active cardiopulmonary disease. Electronically Signed   By: Ted Mcalpineobrinka  Dimitrova M.D.   On: 10/27/2019 15:49   CT Angio Chest PE W and/or Wo Contrast  Result Date: 10/27/2019 CLINICAL DATA:  Chest pain, chest pressure EXAM: CT ANGIOGRAPHY CHEST WITH CONTRAST TECHNIQUE: Multidetector CT imaging of the chest was performed  using the standard protocol during bolus administration of intravenous contrast. Multiplanar CT image reconstructions and MIPs were obtained to evaluate the vascular anatomy. CONTRAST:  49mL OMNIPAQUE IOHEXOL 350 MG/ML SOLN COMPARISON:  None. FINDINGS: Cardiovascular: Satisfactory opacification of the pulmonary arteries to the segmental level. No evidence of pulmonary embolism. Normal heart size. No pericardial effusion. The thoracic aorta is unremarkable Mediastinum/Nodes: No enlarged mediastinal, hilar, or axillary lymph nodes. Thyroid gland, trachea, and esophagus demonstrate no significant findings. Residual thymic tissue seen within the anterior mediastinum. Lungs/Pleura: Lungs are clear. No pleural effusion or pneumothorax. Central airways are widely patent. Upper Abdomen: No acute abnormality. Musculoskeletal: No acute bone abnormality Review of the MIP images confirms the above findings. IMPRESSION: Negative for pulmonary embolism or other acute intrathoracic process. Electronically Signed   By: Helyn Numbers MD   On: 10/27/2019 17:55    Procedures .Critical Care Performed by: Virgina Norfolk, DO Authorized by: Virgina Norfolk, DO   Critical care provider statement:    Critical care time (minutes):  45   Critical care was necessary to treat or prevent imminent or life-threatening deterioration of the following conditions:  Cardiac failure   Critical care was time spent personally by me on the following activities:  Blood draw for specimens, development of treatment plan with patient or  surrogate, discussions with consultants, discussions with primary provider, evaluation of patient's response to treatment, obtaining history from patient or surrogate, ordering and performing treatments and interventions, ordering and review of laboratory studies, ordering and review of radiographic studies, pulse oximetry, re-evaluation of patient's condition and review of old charts   I assumed direction of critical care for this patient from another provider in my specialty: no     (including critical care time)  Medications Ordered in ED Medications  iohexol (OMNIPAQUE) 350 MG/ML injection 100 mL (80 mLs Intravenous Contrast Given 10/27/19 1721)  ketorolac (TORADOL) 15 MG/ML injection 15 mg (15 mg Intravenous Given 10/27/19 1746)    ED Course  I have reviewed the triage vital signs and the nursing notes.  Pertinent labs & imaging results that were available during my care of the patient were reviewed by me and considered in my medical decision making (see chart for details).    MDM Rules/Calculators/A&P                          Richard Richmond is a 24 year old male with no significant medical history presents the ED with chest pain.  Normal vitals.  No fever.  EKG in triage with ST elevations inferiorly and anterior laterally with ST depression diffusely.  Talked with Dr. Excell Seltzer with cardiology who is on-call for STEMI and seems more consistent with a pericarditis.  Patient has had febrile illness recently that also has returned over the last several days as well.  Was seen here several days ago with EKG that showed a sinus rhythm without these changes.  He had negative troponin.  Negative chest x-ray and a negative Covid test.  He is only partially vaccinated against coronavirus.  States that he has had intermittent chest pain over the last several days but has been worse over the last 4 hours.  No cardiac history in the family.  No medical history per patient.  Denies any cocaine use.  No PE  risk factors.  Will obtain lab work including D-dimer, troponin.  Suspect pericarditis versus myocarditis versus PE.  Seems less likely ACS given pattern on EKG.  Troponin is  6767.  Mild elevation in liver enzymes.  Otherwise no significant anemia, electrolyte abnormality, kidney injury.  Covid test is negative.  CT scan of the chest showed no PE.  No obvious pericardial effusion on that as well.  Overall suspect myopericarditis.  Talked with Dr. Mayford Knife with cardiology who would like patient transferred to [facility] emergency department for evaluation by her team and admission.  Patient was given IV Toradol.  Hemodynamically stable.my care.  Dr. Rush Landmark with emergency department team at [facility] is aware of the patient as well.  This chart was dictated using voice recognition software.  Despite best efforts to proofread,  errors can occur which can change the documentation meaning.   Final Clinical Impression(s) / ED Diagnoses Final diagnoses:  Myopericarditis    Rx / DC Orders ED Discharge Orders    None       Virgina Norfolk, DO 10/27/19 1759    Virgina Norfolk, DO 10/27/19 1800

## 2019-10-27 NOTE — ED Triage Notes (Addendum)
Arrives from home, C/C chest pain/ pressure since yesterday, gradually getting worse. R upper chest, mid-sternal, L upper chest area is where pain and pressure starts. Denies radiation, denies N/V/D. States he had a fever when he was here on the 4th, had 2 COVID-19 tests since then and they were negative.

## 2019-10-27 NOTE — ED Notes (Signed)
Date and time results received: 10/27/19 1923 (use smartphrase ".now" to insert current time)  Test: Troponin Critical Value: 4574  Name of Provider Notified:   Orders Received? Or Actions Taken?: waiting on orders

## 2019-10-27 NOTE — Procedures (Signed)
Per conversation with Dr. Charlton Haws, echo to be completed tomorrow morning, 10/28/19.

## 2019-10-27 NOTE — H&P (Addendum)
Cardiology Admission History and Physical:   Patient ID: Richard Richmond MRN: [id number]; DOB: 1996-02-09   Admission date: 10/27/2019  Primary Care Provider: Patient, No Pcp Per Primary Cardiologist: NONE (NEW) Primary Electrophysiologist:  None   Chief Complaint:  Chest pain/acute myopericarditis  Patient Profile:   Richard Richmond is a 24 y.o. male with no prior past medical history who presented to [facility] ER with complaints of chest pain and Cardiologist now asked to admit for acute myopericarditis.   History of Present Illness:   Richard Richmond is a 24yo male with no prior past medical history.  He was in his USOH until a few days ago when he developed a febrile illness.  He was seen in the ER a few days ago with normal EKG and intermittent chest pain and hsTrop was negative.  Cxray was normal and COVID 19 was negative.  He has had 1 COVID 19 vaccine thus far.  He has had intermittent CP over the past few days but over the past day it has worsened in severity.  He has no family hx of CAD and denies any drug use. He has smoked in the past but currently does not smoke.  He presented again to the ER today and was noted to have acute diffuse ST elevation.  EKG was reviewed by Dr. Excell Seltzer and felt to represent acute pericarditis.  Chest CTA was done showing no acute PE or intrathoracic process.  hsTrop came back at 3688 and was 3 on 10/25/2019.  Cardiology is now asked to admit patient for treatment of acute myopericarditis.    History reviewed. No pertinent past medical history.  History reviewed. No pertinent surgical history.   Medications Prior to Admission: Prior to Admission medications   Medication Sig Start Date End Date Taking? Authorizing Provider  acetaminophen (TYLENOL) 500 MG tablet Take 1,000 mg by mouth every 6 (six) hours as needed for moderate pain.   Yes [provider]     Allergies:   No Known Allergies  Social History:   Social History   Socioeconomic History    . Marital status: Single    Spouse name: Not on file  . Number of children: Not on file  . Years of education: Not on file  . Highest education level: Not on file  Occupational History  . Not on file  Tobacco Use  . Smoking status: Former Games developer  . Smokeless tobacco: Former Engineer, water and Sexual Activity  . Alcohol use: Not Currently  . Drug use: Never  . Sexual activity: Yes    Birth control/protection: Condom  Other Topics Concern  . Not on file  Social History Narrative  . Not on file   Social Determinants of Health   Financial Resource Strain:   . Difficulty of Paying Living Expenses: Not on file  Food Insecurity:   . Worried About Programme researcher, broadcasting/film/video in the Last Year: Not on file  . Ran Out of Food in the Last Year: Not on file  Transportation Needs:   . Lack of Transportation (Medical): Not on file  . Lack of Transportation (Non-Medical): Not on file  Physical Activity:   . Days of Exercise per Week: Not on file  . Minutes of Exercise per Session: Not on file  Stress:   . Feeling of Stress : Not on file  Social Connections:   . Frequency of Communication with Friends and Family: Not on file  . Frequency of Social Gatherings with Friends  and Family: Not on file  . Attends Religious Services: Not on file  . Active Member of Clubs or Organizations: Not on file  . Attends Banker Meetings: Not on file  . Marital Status: Not on file  Intimate Partner Violence:   . Fear of Current or Ex-Partner: Not on file  . Emotionally Abused: Not on file  . Physically Abused: Not on file  . Sexually Abused: Not on file    Family History:   The patient's family history includes Healthy in his father and mother.    ROS:  Please see the history of present illness.  All other ROS reviewed and negative.     Physical Exam/Data:   Vitals:   10/27/19 1700 10/27/19 1715 10/27/19 1745 10/27/19 1800  BP: 117/80 114/69 113/77 119/76  Pulse: 87 85 90 83  Resp:  (!) 24 (!) 25 (!) 26 (!) 25  Temp:      TempSrc:      SpO2: 97% 99% 99% 99%  Weight:      Height:       No intake or output data in the 24 hours ending 10/27/19 1823 Last 3 Weights 10/27/2019 10/25/2019  Weight (lbs) 160 lb 130 lb  Weight (kg) 72.576 kg 58.968 kg     Body mass index is 25.82 kg/m.  General:  Well nourished, well developed, in no acute distress HEENT: normal Lymph: no adenopathy Neck: no JVD Endocrine:  No thryomegaly Vascular: No carotid bruits; FA pulses 2+ bilaterally without bruits  Cardiac:  normal S1, S2; RRR; no murmur  Lungs:  clear to auscultation bilaterally, no wheezing, rhonchi or rales  Abd: soft, nontender, no hepatomegaly  Ext: no edema Musculoskeletal:  No deformities, BUE and BLE strength normal and equal Skin: warm and dry  Neuro:  CNs 2-12 intact, no focal abnormalities noted Psych:  Normal affect    EKG:  The ECG that was done in ER and was personally reviewed and demonstrates 25mm of ST elevation in the inferior leads and 63mm in the anterior leads. EKG done on 10/25/2019 showed diffuse T wave abnormality but no ST elevation  Relevant CV Studies: none  Laboratory Data:  High Sensitivity Troponin:   Recent Labs  Lab 10/25/19 1238 10/27/19 1624  TROPONINIHS 3 3,688*      Chemistry Recent Labs  Lab 10/25/19 1238 10/27/19 1624  NA 135 135  K 3.9 3.5  CL 96* 95*  CO2 24 28  GLUCOSE 108* 102*  BUN 12 12  CREATININE 0.98 0.86  CALCIUM 10.0 9.5  GFRNONAA >60 >60  GFRAA >60 >60  ANIONGAP 15 12    Recent Labs  Lab 10/27/19 1624  PROT 8.6*  ALBUMIN 4.3  AST 261*  ALT 221*  ALKPHOS 106  BILITOT 0.9   Hematology Recent Labs  Lab 10/25/19 1238 10/27/19 1624  WBC 6.3 4.5  RBC 5.09 5.10  HGB 14.2 14.4  HCT 41.7 41.8  MCV 81.9 82.0  MCH 27.9 28.2  MCHC 34.1 34.4  RDW 12.0 12.2  PLT 160 135*   BNP Recent Labs  Lab 10/27/19 1546  BNP 18.2    DDimer  Recent Labs  Lab 10/27/19 1624  DDIMER 2.05*      Radiology/Studies:  DG Chest 2 View  Result Date: 10/27/2019 CLINICAL DATA:  Chest pain and shortness of breath. EXAM: CHEST - 2 VIEW COMPARISON:  October 25, 2019 FINDINGS: Cardiomediastinal silhouette is normal. Mediastinal contours appear intact. There is no evidence of focal  airspace consolidation, pleural effusion or pneumothorax. Osseous structures are without acute abnormality. Soft tissues are grossly normal. IMPRESSION: No active cardiopulmonary disease. Electronically Signed   By: Ted Mcalpine M.D.   On: 10/27/2019 15:49   CT Angio Chest PE W and/or Wo Contrast  Result Date: 10/27/2019 CLINICAL DATA:  Chest pain, chest pressure EXAM: CT ANGIOGRAPHY CHEST WITH CONTRAST TECHNIQUE: Multidetector CT imaging of the chest was performed using the standard protocol during bolus administration of intravenous contrast. Multiplanar CT image reconstructions and MIPs were obtained to evaluate the vascular anatomy. CONTRAST:  62mL OMNIPAQUE IOHEXOL 350 MG/ML SOLN COMPARISON:  None. FINDINGS: Cardiovascular: Satisfactory opacification of the pulmonary arteries to the segmental level. No evidence of pulmonary embolism. Normal heart size. No pericardial effusion. The thoracic aorta is unremarkable Mediastinum/Nodes: No enlarged mediastinal, hilar, or axillary lymph nodes. Thyroid gland, trachea, and esophagus demonstrate no significant findings. Residual thymic tissue seen within the anterior mediastinum. Lungs/Pleura: Lungs are clear. No pleural effusion or pneumothorax. Central airways are widely patent. Upper Abdomen: No acute abnormality. Musculoskeletal: No acute bone abnormality Review of the MIP images confirms the above findings. IMPRESSION: Negative for pulmonary embolism or other acute intrathoracic process. Electronically Signed   By: Helyn Numbers MD   On: 10/27/2019 17:55       HEAR Score (for undifferentiated chest pain):  HEAR Score: 3    Assessment and Plan:   1. Chest  pain -patient presents with acute chest pain after a febrile illness -initial hsTrop 2 days ago was normal and now elevated at 3688 -EKG shows diffuse ST elevation in the inferior and anterior leads -presentation and findings most consistent with acute myopericarditis -no pericardial effusion on chest CT -COVID 19 neg x 2 but has only received 1 dose of COVID 19 vaccine -will get stat 2D echo to assess LVF  -continue to cycle hsTrop -start colchicine 0.6mg  BID -start High dose NSAIDs with Motrin 800mg  TID -GI prophylaxis with Protonix 40mg  daily -will get cardiac MRI in am  -transfer to [facility] -check CRP, sed rate -SQ Heparin for DVT prophylaxis  Severity of Illness: The appropriate patient status for this patient is OBSERVATION. Observation status is judged to be reasonable and necessary in order to provide the required intensity of service to ensure the patient's safety. The patient's presenting symptoms, physical exam findings, and initial radiographic and laboratory data in the context of their medical condition is felt to place them at decreased risk for further clinical deterioration. Furthermore, it is anticipated that the patient will be medically stable for discharge from the hospital within 2 midnights of admission. The following factors support the patient status of observation.   " The patient's presenting symptoms include chest pain. " The physical exam findings include none. " The initial radiographic and laboratory data are abnormal EKG and elevated troponin c/w myopericarditis.     For questions or updates, please contact [facility] Please consult [email or web address] for contact info under        Signed, , MD  10/27/2019 6:23 PM

## 2019-10-27 NOTE — Progress Notes (Signed)
Progress Note  Patient Name: Richard Richmond Date of Encounter: 10/27/2019  [facility] Cardiologist: Armanda Magic, MD  Subjective  Patient seen and examined upon arrival to [facility] ED.  In brief, patient is a 24 year old male with no significant PMH who presented to [facility] ER with chest pain found to have elevated troponin and diffuse STE on ECG consistent with acute myopericarditis. Transferred to [facility] for further management.  Currently, HD stable with BP 105/71, HR 80s. He is comfortable and states chest pain is improving. Limited bedside TTE with preserved LVEF and no significant effusion. ECG with diffuse STE.  Inpatient Medications    Scheduled Meds: . colchicine  0.6 mg Oral Daily  . ibuprofen  800 mg Oral TID  . [START ON 10/28/2019] pantoprazole  40 mg Oral Q0600   Continuous Infusions:  PRN Meds:    Vital Signs    Vitals:   10/27/19 1955 10/27/19 2000 10/27/19 2004 10/27/19 2031  BP:  113/75  105/71  Pulse: 86 85  82  Resp: (!) 21 18  18   Temp:   98.3 F (36.8 C) 99 F (37.2 C)  TempSrc:   Oral Oral  SpO2: 98% 97%  96%  Weight:      Height:       No intake or output data in the 24 hours ending 10/27/19 2142 Last 3 Weights 10/27/2019 10/25/2019  Weight (lbs) 160 lb 130 lb  Weight (kg) 72.576 kg 58.968 kg      Telemetry    SR, diffuse STE   Physical Exam   GEN: Comfortable, NAD Neck: No JVD Cardiac: RRR, no murmurs, rubs, or gallops.  Respiratory: Clear to auscultation bilaterally. GI: Soft, nontender, non-distended  MS: No edema; No deformity. Neuro:  Nonfocal  Psych: Normal affect   Labs    High Sensitivity Troponin:   Recent Labs  Lab 10/25/19 1238 10/27/19 1624 10/27/19 1732  TROPONINIHS 3 3,688* 4,574*      Chemistry Recent Labs  Lab 10/25/19 1238 10/27/19 1624  NA 135 135  K 3.9 3.5  CL 96* 95*  CO2 24 28  GLUCOSE 108* 102*  BUN 12 12  CREATININE 0.98 0.86  CALCIUM 10.0 9.5  PROT  --  8.6*  ALBUMIN  --  4.3  AST  --  261*    ALT  --  221*  ALKPHOS  --  106  BILITOT  --  0.9  GFRNONAA >60 >60  GFRAA >60 >60  ANIONGAP 15 12     Hematology Recent Labs  Lab 10/25/19 1238 10/27/19 1624  WBC 6.3 4.5  RBC 5.09 5.10  HGB 14.2 14.4  HCT 41.7 41.8  MCV 81.9 82.0  MCH 27.9 28.2  MCHC 34.1 34.4  RDW 12.0 12.2  PLT 160 135*    BNP Recent Labs  Lab 10/27/19 1546  BNP 18.2     DDimer  Recent Labs  Lab 10/27/19 1624  DDIMER 2.05*     Radiology    DG Chest 2 View  Result Date: 10/27/2019 CLINICAL DATA:  Chest pain and shortness of breath. EXAM: CHEST - 2 VIEW COMPARISON:  October 25, 2019 FINDINGS: Cardiomediastinal silhouette is normal. Mediastinal contours appear intact. There is no evidence of focal airspace consolidation, pleural effusion or pneumothorax. Osseous structures are without acute abnormality. Soft tissues are grossly normal. IMPRESSION: No active cardiopulmonary disease. Electronically Signed   By: October 27, 2019 M.D.   On: 10/27/2019 15:49   CT Angio Chest PE W  and/or Wo Contrast  Result Date: 10/27/2019 CLINICAL DATA:  Chest pain, chest pressure EXAM: CT ANGIOGRAPHY CHEST WITH CONTRAST TECHNIQUE: Multidetector CT imaging of the chest was performed using the standard protocol during bolus administration of intravenous contrast. Multiplanar CT image reconstructions and MIPs were obtained to evaluate the vascular anatomy. CONTRAST:  78mL OMNIPAQUE IOHEXOL 350 MG/ML SOLN COMPARISON:  None. FINDINGS: Cardiovascular: Satisfactory opacification of the pulmonary arteries to the segmental level. No evidence of pulmonary embolism. Normal heart size. No pericardial effusion. The thoracic aorta is unremarkable Mediastinum/Nodes: No enlarged mediastinal, hilar, or axillary lymph nodes. Thyroid gland, trachea, and esophagus demonstrate no significant findings. Residual thymic tissue seen within the anterior mediastinum. Lungs/Pleura: Lungs are clear. No pleural effusion or pneumothorax. Central  airways are widely patent. Upper Abdomen: No acute abnormality. Musculoskeletal: No acute bone abnormality Review of the MIP images confirms the above findings. IMPRESSION: Negative for pulmonary embolism or other acute intrathoracic process. Electronically Signed   By: Helyn Numbers MD   On: 10/27/2019 17:55     Assessment & Plan  1. Chest pain -Patient presents with acute chest pain after febrile illness found to have elevated troponin and diffuse ST elevations on ECG concerning for myopericarditis -CT chest without pericardial effusion and bedside TTE with preserved LV function without effusion -Patient is HD stable and comfortable -TTE in AM -MRI ordered  -Colchicine 0.6mg  BID -High dose NSAIDs with motrin 800mg  TID -Monitor on telemetry -COVID-19 testing negative -SubQ heparin -Would avoid further tylenol as patient has notable transaminitis -Rest of plan per primary Cardiology team   For questions or updates, please contact [facility] Please consult [email or web address] for contact info under     Signed, , MD  10/27/2019, 9:42 PM

## 2019-10-28 ENCOUNTER — Observation Stay (HOSPITAL_COMMUNITY): Payer: Self-pay

## 2019-10-28 ENCOUNTER — Encounter (HOSPITAL_COMMUNITY): Payer: Self-pay | Admitting: Cardiology

## 2019-10-28 DIAGNOSIS — R079 Chest pain, unspecified: Secondary | ICD-10-CM

## 2019-10-28 DIAGNOSIS — I319 Disease of pericardium, unspecified: Secondary | ICD-10-CM

## 2019-10-28 DIAGNOSIS — B3322 Viral myocarditis: Secondary | ICD-10-CM

## 2019-10-28 LAB — ECHOCARDIOGRAM COMPLETE
Area-P 1/2: 5.13 cm2
Height: 66 in
S' Lateral: 2.9 cm
Weight: 2028.8 oz

## 2019-10-28 LAB — COMPREHENSIVE METABOLIC PANEL
ALT: 330 U/L — ABNORMAL HIGH (ref 0–44)
AST: 399 U/L — ABNORMAL HIGH (ref 15–41)
Albumin: 3.3 g/dL — ABNORMAL LOW (ref 3.5–5.0)
Alkaline Phosphatase: 95 U/L (ref 38–126)
Anion gap: 8 (ref 5–15)
BUN: 10 mg/dL (ref 6–20)
CO2: 30 mmol/L (ref 22–32)
Calcium: 9.1 mg/dL (ref 8.9–10.3)
Chloride: 98 mmol/L (ref 98–111)
Creatinine, Ser: 0.85 mg/dL (ref 0.61–1.24)
GFR calc Af Amer: >60 mL/min (ref >60)
GFR calc non Af Amer: >60 mL/min (ref >60)
Glucose, Bld: 148 mg/dL — ABNORMAL HIGH (ref 70–99)
Potassium: 3.5 mmol/L (ref 3.5–5.1)
Sodium: 136 mmol/L (ref 135–145)
Total Bilirubin: 1.1 mg/dL (ref 0.3–1.2)
Total Protein: 6.8 g/dL (ref 6.5–8.1)

## 2019-10-28 LAB — TROPONIN I (HIGH SENSITIVITY): Troponin I (High Sensitivity): 6819 ng/L (ref <18)

## 2019-10-28 LAB — HIV ANTIBODY (ROUTINE TESTING W REFLEX): HIV Screen 4th Generation wRfx: NONREACTIVE

## 2019-10-28 LAB — TSH: TSH: 1.493 u[IU]/mL (ref 0.350–4.500)

## 2019-10-28 IMAGING — MR MR CARD MORPHOLOGY WO/W CM
41 of 47 series · 41 of 47 positions shown · IV contrast (gadavist)
Comparison: none

CLINICAL DATA: Myopericarditis

EXAM:
CARDIAC MRI
TECHNIQUE: The patient was scanned on a 1.5 Tesla GE magnet. A dedicated
cardiac coil was used. Functional imaging was done using Fiesta
sequences. [DATE], and 4 chamber views were done to assess for RWMA's.
Modified ZAPLETAL rule using a short axis stack was used to
calculate an ejection fraction on a dedicated work station using
[employer] software. The patient received 6 cc of Gadavist. After 10
minutes inversion recovery sequences were used to assess for
infiltration and scar tissue.
CONTRAST:  Gadavist

[Series 4: [id number] · axial · 8.0mm · 1.33mm/px · 1 of 16 slices shown]
[im 1/16]
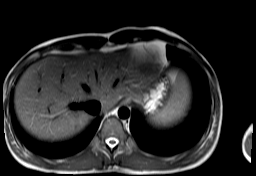

[Series 8: bSSFP · oblique · 8.0mm · 1.61mm/px · 1 of 25 slices shown (1 of 24)]
[im 1/25]
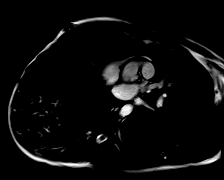

[Series 9: bSSFP · oblique · 8.0mm · 1.61mm/px · 1 of 25 slices shown (2 of 24)]
[im 1/25]
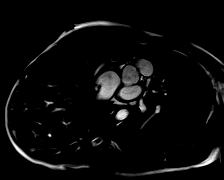

[Series 10: bSSFP · oblique · 8.0mm · 1.61mm/px · 1 of 25 slices shown (3 of 24)]
[im 1/25]
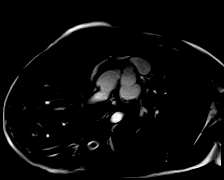

[Series 11: bSSFP · oblique · 8.0mm · 1.61mm/px · 1 of 25 slices shown (4 of 24)]
[im 1/25]
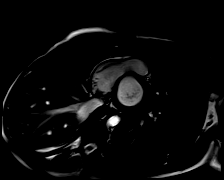

[Series 12: bSSFP · oblique · 8.0mm · 1.61mm/px · 1 of 25 slices shown (5 of 24)]
[im 1/25]
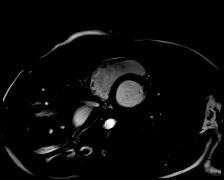

[Series 13: bSSFP · oblique · 8.0mm · 1.61mm/px · 1 of 25 slices shown (6 of 24)]
[im 1/25]
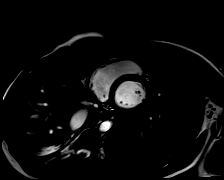

[Series 14: bSSFP · oblique · 8.0mm · 1.61mm/px · 1 of 25 slices shown (7 of 24)]
[im 1/25]
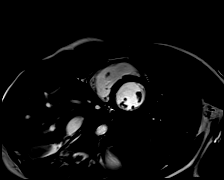

[Series 15: bSSFP · oblique · 8.0mm · 1.61mm/px · 1 of 25 slices shown (8 of 24)]
[im 1/25]
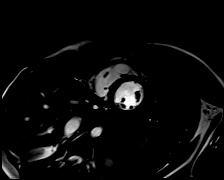

[Series 16: bSSFP · oblique · 8.0mm · 1.61mm/px · 1 of 25 slices shown (9 of 24)]
[im 1/25]
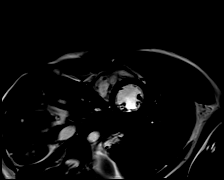

[Series 17: bSSFP · oblique · 8.0mm · 1.61mm/px · 1 of 25 slices shown (10 of 24)]
[im 1/25]
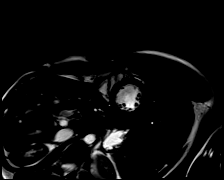

[Series 18: bSSFP · oblique · 8.0mm · 1.61mm/px · 1 of 25 slices shown (11 of 24)]
[im 1/25]
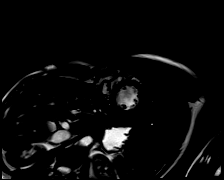

[Series 19: bSSFP · oblique · 8.0mm · 1.61mm/px · 1 of 25 slices shown (12 of 24)]
[im 1/25]
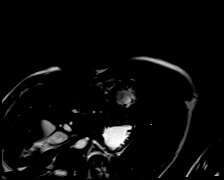

[Series 20: bSSFP · oblique · 8.0mm · 1.61mm/px · 1 of 25 slices shown (13 of 24)]
[im 1/25]
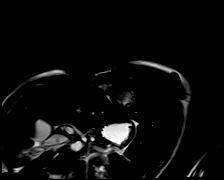

[Series 21: bSSFP · oblique · 8.0mm · 1.61mm/px · 1 of 25 slices shown (14 of 24)]
[im 1/25]
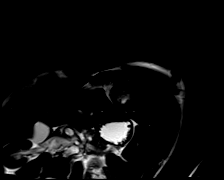

[Series 22: bSSFP · oblique · 8.0mm · 1.61mm/px · 1 of 25 slices shown (15 of 24)]
[im 1/25]
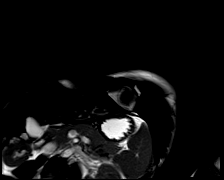

[Series 23: bSSFP · oblique · 8.0mm · 1.61mm/px · 1 of 25 slices shown (16 of 24)]
[im 1/25]
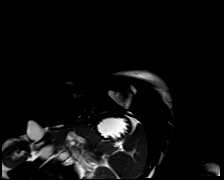

[Series 24: bSSFP · oblique · 8.0mm · 1.61mm/px · 1 of 25 slices shown (17 of 24)]
[im 1/25]
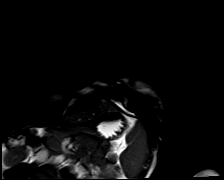

[Series 25: bSSFP · oblique · 8.0mm · 1.61mm/px · 1 of 25 slices shown (18 of 24)]
[im 1/25]
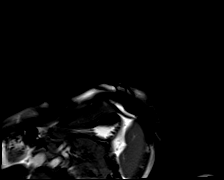

[Series 26: bSSFP · oblique · 6.0mm · 1.41mm/px · 1 of 25 slices shown (19 of 24)]
[im 1/25]
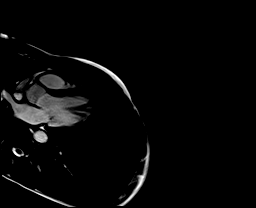

[Series 27: bSSFP · oblique · 6.0mm · 1.41mm/px · 1 of 25 slices shown (20 of 24)]
[im 1/25]
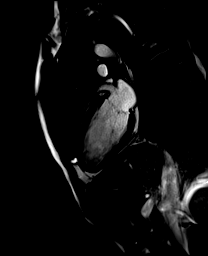

[Series 28: bSSFP · oblique · 6.0mm · 1.41mm/px · 1 of 25 slices shown (21 of 24)]
[im 1/25]
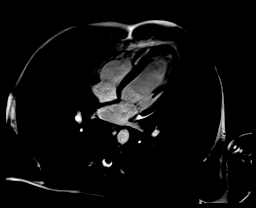

[Series 29: STIR · oblique · 8.0mm · 1.73mm/px · 1 of 19 slices shown]
[im 1/19]
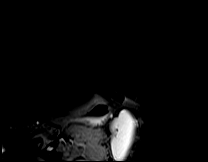

[Series 30: (id)_long_t1 · oblique · 8.0mm · 1.41mm/px · 1 of 24 slices shown]
[im 1/24]
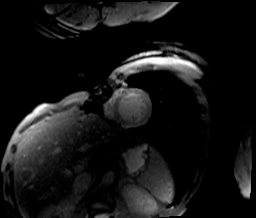

[Series 31: (id)_long_t1_moco · oblique · 8.0mm · 1.41mm/px · 1 of 24 slices shown]
[im 1/24]
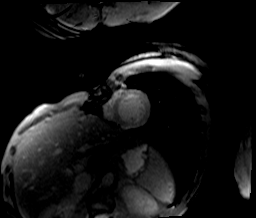

[Series 32: (id)_long_t1_moco_t1 · oblique · 8.0mm · 1.41mm/px · 1 of 6 slices shown]
[im 1/6]
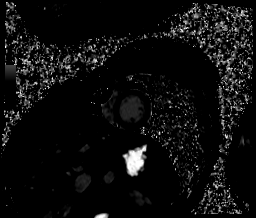

[Series 34: (id)_trufi · oblique · 8.0mm · 1.88mm/px · 1 of 9 slices shown]
[im 1/9]
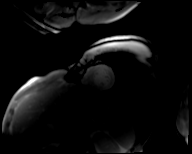

[Series 35: (id)_trufi_moco · oblique · 8.0mm · 1.88mm/px · 1 of 9 slices shown]
[im 1/9]
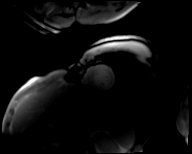

[Series 36: (id)_trufi_moco_t2 · oblique · 8.0mm · 1.88mm/px · 1 of 5 slices shown]
[im 1/5]
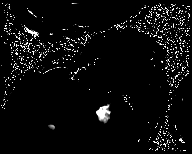

[Series 38: bSSFP · coronal · 6.0mm · 1.41mm/px · 1 of 25 slices shown (22 of 24)]
[im 1/25]
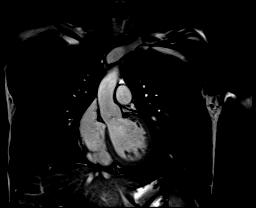

[Series 44: lge_single shot sa · oblique · 8.0mm · 1.98mm/px · 1 of 18 slices shown (1 of 2)]
[im 1/18]
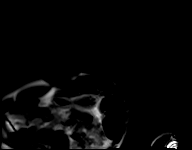

[Series 45: lge_single shot sa · oblique · 8.0mm · 1.98mm/px · 1 of 19 slices shown (2 of 2)]
[im 1/19]
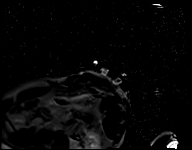

[Series 46: cine rvit · oblique · 6.0mm · 1.41mm/px · 1 of 25 slices shown]
[im 1/25]
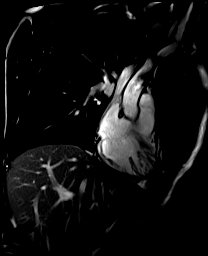

[Series 53: cine rvot · sagittal · 6.0mm · 1.41mm/px · 1 of 25 slices shown]
[im 1/25]
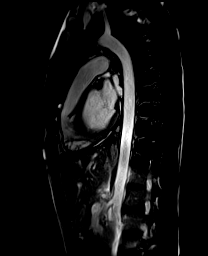

[Series 54: aortic valve cine · axial · 6.0mm · 1.41mm/px · 1 of 25 slices shown (1 of 2)]
[im 1/25]
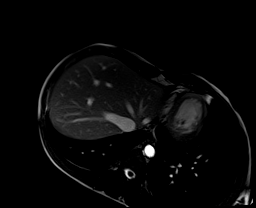

[Series 55: aortic valve cine · axial · 6.0mm · 1.41mm/px · 1 of 25 slices shown (2 of 2)]
[im 1/25]
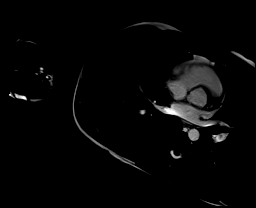

[Series 56: (id)_short_t1 · oblique · 8.0mm · 1.41mm/px · 1 of 27 slices shown]
[im 1/27]
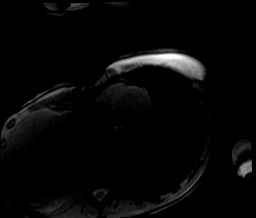

[Series 57: (id)_short_t1_moco · oblique · 8.0mm · 1.41mm/px · 1 of 27 slices shown]
[im 1/27]
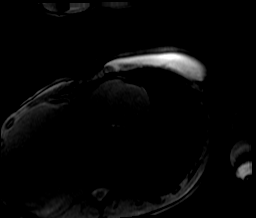

[Series 58: (id)_short_t1_moco_t1 · oblique · 8.0mm · 1.41mm/px · 1 of 6 slices shown]
[im 1/6]
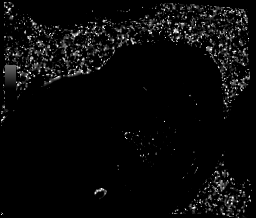

[Series 61: bSSFP · oblique · 8.0mm · 1.73mm/px · 1 of 18 slices shown (23 of 24)]
[im 1/18]
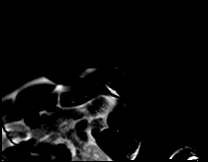

[Series 62: bSSFP · oblique · 8.0mm · 1.73mm/px · 1 of 19 slices shown (24 of 24)]
[im 1/19]
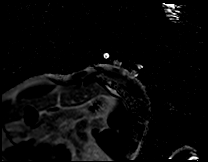

[41 of 47 positions shown; findings below may reference images not displayed]

FINDINGS: Normal cardiac chamber sizes. Normal aortic root. Normal cardiac
valves. Normal RV size and function. Normal LV size mild distal
septal and apical hypokinesis quantitative EF 56% (EDV 115 cc ESV 51
cc SV 64 cc) Septal thickness 8 mm normal coronary artery origins no
anomaly There is a trivial / small pericardial effusion along the RV
free wall, apex and posterior LV. There is marked hyper-enhancement
of the apical and RV free wall pericardium with a small area of
myocardial uptake int he distal septum and apex
IMPRESSION: 1. Findings consistent with myopericarditis with trivial to small
pericardial effusion. Marked hyper-enhancement of the apical and RV
free wall pericardium with small amount of myocardial uptake in the
distal septum and apex consistent with myopericarditis Area
corresponds to RWMA with mild distal septal and apical hypokinesis

2. Overall preserved EF 56% with mild distal septal and apical
hypokinesis

3.  Normal coronary artery origins no anomaly

4.  Normal RV size and function

5.  Normal cardiac valves

6.  Normal aortic root

7.  No PFO/ASD/VSD

ZAPLETAL

## 2019-10-28 MED ORDER — GADOBUTROL 1 MMOL/ML IV SOLN
6.0000 mL | Freq: Once | INTRAVENOUS | Status: AC | PRN
  Administered 2019-10-28: 6 mL via INTRAVENOUS

## 2019-10-28 MED ORDER — OXYCODONE HCL 5 MG PO TABS
5.0000 mg | ORAL_TABLET | Freq: Four times a day (QID) | ORAL | Status: DC | PRN
  Administered 2019-10-28: 5 mg via ORAL
  Filled 2019-10-28: qty 1

## 2019-10-28 NOTE — Plan of Care (Signed)

## 2019-10-28 NOTE — Progress Notes (Signed)
  Echocardiogram 2D Echocardiogram has been performed.  Richard Richmond 10/28/2019, 10:03 AM

## 2019-10-28 NOTE — Progress Notes (Addendum)
Progress Note  Patient Name: Richard Richmond Date of Encounter: 10/28/2019  Primary Cardiologist: Armanda Magic, MD  Subjective   Patient had some recurrent CP earlier this AM, MAR indicates he was given SL NTG and oxycodone. Dosed with ibuprofen at 9am. Chest pain has eased off, still occasional twitch of pain.  Inpatient Medications    Scheduled Meds: . colchicine  0.6 mg Oral Daily  . heparin  5,000 Units Subcutaneous Q8H  . ibuprofen  800 mg Oral TID  . pantoprazole  40 mg Oral Q0600  . sodium chloride flush  3 mL Intravenous Q12H   Continuous Infusions: . sodium chloride     PRN Meds: sodium chloride, acetaminophen, ALPRAZolam, nitroGLYCERIN, ondansetron (ZOFRAN) IV, oxyCODONE, sodium chloride flush, zolpidem   Vital Signs    Vitals:   10/27/19 2359 10/28/19 0442 10/28/19 0504 10/28/19 0906  BP:  104/73 111/72 98/61  Pulse:  78 85 85  Resp:  18  20  Temp:  97.6 F (36.4 C)  97.8 F (36.6 C)  TempSrc:  Oral  Oral  SpO2: 96% 100% 97% 99%  Weight:      Height:        Intake/Output Summary (Last 24 hours) at 10/28/2019 0926 Last data filed at 10/28/2019 0847 Gross per 24 hour  Intake 480 ml  Output 100 ml  Net 380 ml   Last 3 Weights 10/27/2019 10/27/2019 10/25/2019  Weight (lbs) 126 lb 12.8 oz 160 lb 130 lb  Weight (kg) 57.516 kg 72.576 kg 58.968 kg     Telemetry    NSR - Personally Reviewed  ECG    NSR  79bpm with diffuse ST elevation inferiorly and V3-V6- Personally Reviewed  Physical Exam   GEN: No acute distress.  HEENT: Normocephalic, atraumatic, sclera non-icteric. Neck: No JVD or bruits. Cardiac: RRR no murmurs, rubs, or gallops.  Radials/DP/PT 1+ and equal bilaterally.  Respiratory: Clear to auscultation bilaterally. Breathing is unlabored. GI: Soft, nontender, non-distended, BS +x 4. MS: no deformity. Extremities: No clubbing or cyanosis. No edema. Distal pedal pulses are 2+ and equal bilaterally. Neuro:  AAOx3. Follows commands. Psych:   Responds to questions appropriately with a normal affect.  Labs    High Sensitivity Troponin:   Recent Labs  Lab 10/25/19 1238 10/27/19 1624 10/27/19 1732 10/28/19 0155  TROPONINIHS 3 3,688* 4,574* 8,127*      Cardiac EnzymesNo results for input(s): TROPONINI in the last 168 hours. No results for input(s): TROPIPOC in the last 168 hours.   Chemistry Recent Labs  Lab 10/25/19 1238 10/27/19 1624 10/28/19 0155  NA 135 135 136  K 3.9 3.5 3.5  CL 96* 95* 98  CO2 24 28 30   GLUCOSE 108* 102* 148*  BUN 12 12 10   CREATININE 0.98 0.86 0.85  CALCIUM 10.0 9.5 9.1  PROT  --  8.6* 6.8  ALBUMIN  --  4.3 3.3*  AST  --  261* 399*  ALT  --  221* 330*  ALKPHOS  --  106 95  BILITOT  --  0.9 1.1  GFRNONAA >60 >60 >60  GFRAA >60 >60 >60  ANIONGAP 15 12 8      Hematology Recent Labs  Lab 10/25/19 1238 10/27/19 1624  WBC 6.3 4.5  RBC 5.09 5.10  HGB 14.2 14.4  HCT 41.7 41.8  MCV 81.9 82.0  MCH 27.9 28.2  MCHC 34.1 34.4  RDW 12.0 12.2  PLT 160 135*    BNP Recent Labs  Lab 10/27/19 1546  BNP  18.2     DDimer  Recent Labs  Lab 10/27/19 1624  DDIMER 2.05*     Radiology    DG Chest 2 View  Result Date: 10/27/2019 CLINICAL DATA:  Chest pain and shortness of breath. EXAM: CHEST - 2 VIEW COMPARISON:  October 25, 2019 FINDINGS: Cardiomediastinal silhouette is normal. Mediastinal contours appear intact. There is no evidence of focal airspace consolidation, pleural effusion or pneumothorax. Osseous structures are without acute abnormality. Soft tissues are grossly normal. IMPRESSION: No active cardiopulmonary disease. Electronically Signed   By: Ted Mcalpine M.D.   On: 10/27/2019 15:49   CT Angio Chest PE W and/or Wo Contrast  Result Date: 10/27/2019 CLINICAL DATA:  Chest pain, chest pressure EXAM: CT ANGIOGRAPHY CHEST WITH CONTRAST TECHNIQUE: Multidetector CT imaging of the chest was performed using the standard protocol during bolus administration of intravenous  contrast. Multiplanar CT image reconstructions and MIPs were obtained to evaluate the vascular anatomy. CONTRAST:  23mL OMNIPAQUE IOHEXOL 350 MG/ML SOLN COMPARISON:  None. FINDINGS: Cardiovascular: Satisfactory opacification of the pulmonary arteries to the segmental level. No evidence of pulmonary embolism. Normal heart size. No pericardial effusion. The thoracic aorta is unremarkable Mediastinum/Nodes: No enlarged mediastinal, hilar, or axillary lymph nodes. Thyroid gland, trachea, and esophagus demonstrate no significant findings. Residual thymic tissue seen within the anterior mediastinum. Lungs/Pleura: Lungs are clear. No pleural effusion or pneumothorax. Central airways are widely patent. Upper Abdomen: No acute abnormality. Musculoskeletal: No acute bone abnormality Review of the MIP images confirms the above findings. IMPRESSION: Negative for pulmonary embolism or other acute intrathoracic process. Electronically Signed   By: Helyn Numbers MD   On: 10/27/2019 17:55   MR CARDIAC MORPHOLOGY W WO CONTRAST  Result Date: 10/28/2019 CLINICAL DATA:  Myopericarditis EXAM: CARDIAC MRI TECHNIQUE: The patient was scanned on a 1.5 Tesla GE magnet. A dedicated cardiac coil was used. Functional imaging was done using Fiesta sequences. 2,3, and 4 chamber views were done to assess for RWMA's. Modified Simpson's rule using a short axis stack was used to calculate an ejection fraction on a dedicated work [employer]. The patient received 6 cc of Gadavist. After 10 minutes inversion recovery sequences were used to assess for infiltration and scar tissue. CONTRAST:  UKGURKYH FINDINGS: Normal cardiac chamber sizes. Normal aortic root. Normal cardiac valves. Normal RV size and function. Normal LV size mild distal septal and apical hypokinesis quantitative EF 56% (EDV 115 cc ESV 51 cc SV 64 cc) Septal thickness 8 mm normal coronary artery origins no anomaly There is a trivial / small pericardial effusion  along the RV free wall, apex and posterior LV. There is marked hyper-enhancement of the apical and RV free wall pericardium with a small area of myocardial uptake int he distal septum and apex IMPRESSION: 1. Findings consistent with myopericarditis with trivial to small pericardial effusion. Marked hyper-enhancement of the apical and RV free wall pericardium with small amount of myocardial uptake in the distal septum and apex consistent with myopericarditis Area corresponds to RWMA with mild distal septal and apical hypokinesis 2. Overall preserved EF 56% with mild distal septal and apical hypokinesis 3.  Normal coronary artery origins no anomaly 4.  Normal RV size and function 5.  Normal cardiac valves 6.  Normal aortic root 7.  No PFO/ASD/VSD Charlton Haws Electronically Signed   By: Charlton Haws M.D.   On: 10/28/2019 08:51    Cardiac Studies   CMRI report as above  2D echo pending  Patient Profile  24 y.o. male with former tobacco abuse who reports history of intermittent sharp chest pain. He recently developed fever and headache on 9/2 (temp 101.5). He did a rapid Covid test at home which was negative then had a repeat test in the ED 9/4 which was negative. He then developed chest pain and acute diffuse ST elevation along with elevated troponin. Clinical scenario felt concerning for myopericarditis. Cardiac MRI was felt consistent with myopericarditis. Covid test negative on arrival. Had 1 Covid vaccine in July.  Assessment & Plan    1. Chest pain with elevated troponin felt consistent with myopericarditis - cMRI c/w such, 2D echo pending - started on colchicine/ibuprofen/PPI for component of pericarditis  - will need to refrain from physical exertion  - f/u troponin level in AM for trending - will discuss further w/u with MD  2. Abnormal transaminases - ? Related to acute process - continue to follow levels - will discuss potential hepatitis panel with MD and impact of NSAID  dosing  3. Mild thrombocytopenia - plt count 160->135 -> continue to trend in AM  4. Hyperglycemia - glucose mildly elevated this admission - check A1C in AM  For questions or updates, please contact [facility] Please consult [email or web address] for contact info under Cardiology/STEMI.  Signed, Laurann Montana, PA-C 10/28/2019, 9:26 AM   As above, patient seen and examined.  Patient continues to have intermittent chest pain.  Electrocardiogram consistent with acute pericarditis.  Troponin elevated.  Cardiac MRI shows myopericarditis. Echocardiogram pending we will continue with ibuprofen, colchicine and pain medications.  Note LFTs are elevated likely related to ongoing viral process.  Will need to be rechecked as an outpatient.  Possible discharge tomorrow if pain controlled. Olga Millers

## 2019-10-28 NOTE — Progress Notes (Signed)
Pt c/o 7/10 sternal chest pain. Troponin continues to increase to 6819. EKG performed. Nitro SL given x1. Pt became mildly diaphoretic and had an episode of emesis. VS stable. Pt pain decreased to 5/10. MD paged

## 2019-10-29 ENCOUNTER — Other Ambulatory Visit: Payer: Self-pay | Admitting: Physician Assistant

## 2019-10-29 ENCOUNTER — Telehealth: Payer: Self-pay | Admitting: Physician Assistant

## 2019-10-29 ENCOUNTER — Encounter (HOSPITAL_COMMUNITY): Payer: Self-pay | Admitting: Cardiology

## 2019-10-29 DIAGNOSIS — D696 Thrombocytopenia, unspecified: Secondary | ICD-10-CM

## 2019-10-29 DIAGNOSIS — R7989 Other specified abnormal findings of blood chemistry: Secondary | ICD-10-CM

## 2019-10-29 DIAGNOSIS — B3323 Viral pericarditis: Secondary | ICD-10-CM

## 2019-10-29 DIAGNOSIS — I319 Disease of pericardium, unspecified: Secondary | ICD-10-CM

## 2019-10-29 LAB — CBC
HCT: 41.3 % (ref 39.0–52.0)
Hemoglobin: 13.8 g/dL (ref 13.0–17.0)
MCH: 27.5 pg (ref 26.0–34.0)
MCHC: 33.4 g/dL (ref 30.0–36.0)
MCV: 82.4 fL (ref 80.0–100.0)
Platelets: 170 10*3/uL (ref 150–400)
RBC: 5.01 MIL/uL (ref 4.22–5.81)
RDW: 12.5 % (ref 11.5–15.5)
WBC: 4.5 10*3/uL (ref 4.0–10.5)
nRBC: 0 % (ref 0.0–0.2)

## 2019-10-29 LAB — COMPREHENSIVE METABOLIC PANEL
ALT: 777 U/L — ABNORMAL HIGH (ref 0–44)
AST: 586 U/L — ABNORMAL HIGH (ref 15–41)
Albumin: 3.5 g/dL (ref 3.5–5.0)
Alkaline Phosphatase: 103 U/L (ref 38–126)
Anion gap: 11 (ref 5–15)
BUN: 7 mg/dL (ref 6–20)
CO2: 29 mmol/L (ref 22–32)
Calcium: 9.4 mg/dL (ref 8.9–10.3)
Chloride: 97 mmol/L — ABNORMAL LOW (ref 98–111)
Creatinine, Ser: 0.74 mg/dL (ref 0.61–1.24)
GFR calc Af Amer: >60 mL/min (ref >60)
GFR calc non Af Amer: >60 mL/min (ref >60)
Glucose, Bld: 94 mg/dL (ref 70–99)
Potassium: 3.9 mmol/L (ref 3.5–5.1)
Sodium: 137 mmol/L (ref 135–145)
Total Bilirubin: 1 mg/dL (ref 0.3–1.2)
Total Protein: 7.3 g/dL (ref 6.5–8.1)

## 2019-10-29 LAB — HEMOGLOBIN A1C
Hgb A1c MFr Bld: 5.4 % (ref 4.8–5.6)
Mean Plasma Glucose: 108.28 mg/dL

## 2019-10-29 LAB — TROPONIN I (HIGH SENSITIVITY): Troponin I (High Sensitivity): 5830 ng/L (ref <18)

## 2019-10-29 LAB — SEDIMENTATION RATE: Sed Rate: 20 mm/hr — ABNORMAL HIGH (ref 0–16)

## 2019-10-29 MED ORDER — IBUPROFEN 800 MG PO TABS: 800.0000 mg | ORAL_TABLET | Freq: Three times a day (TID) | ORAL | 0 refills | Status: DC

## 2019-10-29 MED ORDER — COLCHICINE 0.6 MG PO TABS: 0.6000 mg | ORAL_TABLET | Freq: Every day | ORAL | 2 refills | Status: DC

## 2019-10-29 MED ORDER — PANTOPRAZOLE SODIUM 40 MG PO TBEC: 40.0000 mg | DELAYED_RELEASE_TABLET | Freq: Every day | ORAL | 0 refills | Status: DC

## 2019-10-29 MED FILL — PANTOPRAZOLE SOD DR 40 MG T: 40 | 15 days supply | Qty: 15 | Fill #0

## 2019-10-29 MED FILL — COLCHICINE 0.6 MG TABS: 0.6 | 30 days supply | Qty: 30 | Fill #0

## 2019-10-29 MED FILL — IBUPROFEN 800 MG TAB: 800 | 12 days supply | Qty: 37 | Fill #0

## 2019-10-29 NOTE — Telephone Encounter (Signed)
    Attention TOC pool,  This patient will need a TOC phone call after discharge. They are being discharged today. Follow-up appointment has already been arranged with: Chelsea Aus on 9/21, also will come for labwork on 9/13 They are a patient of Armanda Magic, MD.  Thank you! Laurann Montana, PA-C

## 2019-10-29 NOTE — Progress Notes (Signed)
c 

## 2019-10-29 NOTE — Progress Notes (Signed)
Progress Note  Patient Name: Richard Richmond Date of Encounter: 10/29/2019  [facility] Cardiologist: Armanda Magic, MD   Subjective   CP improving; no dyspnea  Inpatient Medications    Scheduled Meds: . colchicine  0.6 mg Oral Daily  . heparin  5,000 Units Subcutaneous Q8H  . ibuprofen  800 mg Oral TID  . pantoprazole  40 mg Oral Q0600  . sodium chloride flush  3 mL Intravenous Q12H   Continuous Infusions: . sodium chloride     PRN Meds: sodium chloride, acetaminophen, ALPRAZolam, ondansetron (ZOFRAN) IV, oxyCODONE, sodium chloride flush, zolpidem   Vital Signs    Vitals:   10/29/19 0105 10/29/19 0328 10/29/19 0827 10/29/19 0836  BP:  95/61  (!) 101/59  Pulse:  83  86  Resp:  16  16  Temp:  98.1 F (36.7 C)  98.4 F (36.9 C)  TempSrc:  Oral  Oral  SpO2:  98% 100% 100%  Weight: 56.8 kg     Height:        Intake/Output Summary (Last 24 hours) at 10/29/2019 0912 Last data filed at 10/28/2019 2150 Gross per 24 hour  Intake 700 ml  Output 900 ml  Net -200 ml   Last 3 Weights 10/29/2019 10/27/2019 10/27/2019  Weight (lbs) 125 lb 3.5 oz 126 lb 12.8 oz 160 lb  Weight (kg) 56.8 kg 57.516 kg 72.576 kg      Telemetry    Sinus - Personally Reviewed   Physical Exam   GEN: No acute distress.   Neck: No JVD Cardiac: RRR, no murmurs, rubs, or gallops.  Respiratory: Clear to auscultation bilaterally. GI: Soft, nontender, non-distended  MS: No edema Neuro:  Nonfocal  Psych: Normal affect   Labs    High Sensitivity Troponin:   Recent Labs  Lab 10/25/19 1238 10/27/19 1624 10/27/19 1732 10/28/19 0155  TROPONINIHS 3 3,688* 4,574* 6,819*      Chemistry Recent Labs  Lab 10/25/19 1238 10/27/19 1624 10/28/19 0155  NA 135 135 136  K 3.9 3.5 3.5  CL 96* 95* 98  CO2 24 28 30   GLUCOSE 108* 102* 148*  BUN 12 12 10   CREATININE 0.98 0.86 0.85  CALCIUM 10.0 9.5 9.1  PROT  --  8.6* 6.8  ALBUMIN  --  4.3 3.3*  AST  --  261* 399*  ALT  --  221* 330*  ALKPHOS   --  106 95  BILITOT  --  0.9 1.1  GFRNONAA >60 >60 >60  GFRAA >60 >60 >60  ANIONGAP 15 12 8      Hematology Recent Labs  Lab 10/25/19 1238 10/27/19 1624 10/29/19 0401  WBC 6.3 4.5 4.5  RBC 5.09 5.10 5.01  HGB 14.2 14.4 13.8  HCT 41.7 41.8 41.3  MCV 81.9 82.0 82.4  MCH 27.9 28.2 27.5  MCHC 34.1 34.4 33.4  RDW 12.0 12.2 12.5  PLT 160 135* 170    BNP Recent Labs  Lab 10/27/19 1546  BNP 18.2     DDimer  Recent Labs  Lab 10/27/19 1624  DDIMER 2.05*     Radiology    DG Chest 2 View  Result Date: 10/27/2019 CLINICAL DATA:  Chest pain and shortness of breath. EXAM: CHEST - 2 VIEW COMPARISON:  October 25, 2019 FINDINGS: Cardiomediastinal silhouette is normal. Mediastinal contours appear intact. There is no evidence of focal airspace consolidation, pleural effusion or pneumothorax. Osseous structures are without acute abnormality. Soft tissues are grossly normal. IMPRESSION: No active cardiopulmonary disease. Electronically  Signed   By: Ted Mcalpine M.D.   On: 10/27/2019 15:49   CT Angio Chest PE W and/or Wo Contrast  Result Date: 10/27/2019 CLINICAL DATA:  Chest pain, chest pressure EXAM: CT ANGIOGRAPHY CHEST WITH CONTRAST TECHNIQUE: Multidetector CT imaging of the chest was performed using the standard protocol during bolus administration of intravenous contrast. Multiplanar CT image reconstructions and MIPs were obtained to evaluate the vascular anatomy. CONTRAST:  44mL OMNIPAQUE IOHEXOL 350 MG/ML SOLN COMPARISON:  None. FINDINGS: Cardiovascular: Satisfactory opacification of the pulmonary arteries to the segmental level. No evidence of pulmonary embolism. Normal heart size. No pericardial effusion. The thoracic aorta is unremarkable Mediastinum/Nodes: No enlarged mediastinal, hilar, or axillary lymph nodes. Thyroid gland, trachea, and esophagus demonstrate no significant findings. Residual thymic tissue seen within the anterior mediastinum. Lungs/Pleura: Lungs are clear.  No pleural effusion or pneumothorax. Central airways are widely patent. Upper Abdomen: No acute abnormality. Musculoskeletal: No acute bone abnormality Review of the MIP images confirms the above findings. IMPRESSION: Negative for pulmonary embolism or other acute intrathoracic process. Electronically Signed   By: Helyn Numbers MD   On: 10/27/2019 17:55   MR CARDIAC MORPHOLOGY W WO CONTRAST  Result Date: 10/28/2019 CLINICAL DATA:  Myopericarditis EXAM: CARDIAC MRI TECHNIQUE: The patient was scanned on a 1.5 Tesla GE magnet. A dedicated cardiac coil was used. Functional imaging was done using Fiesta sequences. 2,3, and 4 chamber views were done to assess for RWMA's. Modified Simpson's rule using a short axis stack was used to calculate an ejection fraction on a dedicated work [employer]. The patient received 6 cc of Gadavist. After 10 minutes inversion recovery sequences were used to assess for infiltration and scar tissue. CONTRAST:  JSEGBTDV FINDINGS: Normal cardiac chamber sizes. Normal aortic root. Normal cardiac valves. Normal RV size and function. Normal LV size mild distal septal and apical hypokinesis quantitative EF 56% (EDV 115 cc ESV 51 cc SV 64 cc) Septal thickness 8 mm normal coronary artery origins no anomaly There is a trivial / small pericardial effusion along the RV free wall, apex and posterior LV. There is marked hyper-enhancement of the apical and RV free wall pericardium with a small area of myocardial uptake int he distal septum and apex IMPRESSION: 1. Findings consistent with myopericarditis with trivial to small pericardial effusion. Marked hyper-enhancement of the apical and RV free wall pericardium with small amount of myocardial uptake in the distal septum and apex consistent with myopericarditis Area corresponds to RWMA with mild distal septal and apical hypokinesis 2. Overall preserved EF 56% with mild distal septal and apical hypokinesis 3.  Normal coronary artery  origins no anomaly 4.  Normal RV size and function 5.  Normal cardiac valves 6.  Normal aortic root 7.  No PFO/ASD/VSD Charlton Haws Electronically Signed   By: Charlton Haws M.D.   On: 10/28/2019 08:51   ECHOCARDIOGRAM COMPLETE  Result Date: 10/28/2019    ECHOCARDIOGRAM REPORT   Patient Name:   Richard Richmond Date of Exam: 10/28/2019 Medical Rec #:  [id number]        Height:       66.0 in Accession #:    [id number]       Weight:       126.8 lb Date of Birth:  1995-09-05        BSA:          1.648 m Patient Age:    24 years         BP:  117/59 mmHg Patient Gender: M                HR:           80 bpm. Exam Location:  Inpatient Procedure: 2D Echo, Cardiac Doppler and Color Doppler Indications:    Chest Pain 786.50 / R07.9  History:        Patient has no prior history of Echocardiogram examinations.                 Signs/Symptoms:Chest Pain.  Sonographer:    Eulah Pont RDCS Referring Phys: [id number] RHONDA G BARRETT IMPRESSIONS  1. Left ventricular ejection fraction, by estimation, is 55 to 60%. The left ventricle has normal function.  2. Right ventricular systolic function is normal. The right ventricular size is normal. Tricuspid regurgitation signal is inadequate for assessing PA pressure.  3. The mitral valve is normal in structure. Trivial mitral valve regurgitation. No evidence of mitral stenosis.  4. The aortic valve is tricuspid. Aortic valve regurgitation is not visualized. Mild aortic valve sclerosis is present, with no evidence of aortic valve stenosis.  5. The inferior vena cava is normal in size with greater than 50% respiratory variability, suggesting right atrial pressure of 3 mmHg. FINDINGS  Left Ventricle: Left ventricular ejection fraction, by estimation, is 55 to 60%. The left ventricle has normal function. The left ventricle has no regional wall motion abnormalities. The left ventricular internal cavity size was normal in size. There is  no left ventricular hypertrophy. Left ventricular  diastolic parameters were normal. Normal left ventricular filling pressure. Right Ventricle: The right ventricular size is normal. No increase in right ventricular wall thickness. Right ventricular systolic function is normal. Tricuspid regurgitation signal is inadequate for assessing PA pressure. Left Atrium: Left atrial size was normal in size. Right Atrium: Right atrial size was normal in size. Pericardium: There is no evidence of pericardial effusion. Mitral Valve: The mitral valve is normal in structure. Normal mobility of the mitral valve leaflets. Trivial mitral valve regurgitation. No evidence of mitral valve stenosis. Tricuspid Valve: The tricuspid valve is normal in structure. Tricuspid valve regurgitation is trivial. No evidence of tricuspid stenosis. Aortic Valve: The aortic valve is tricuspid. . There is mild thickening and mild calcification of the aortic valve. Aortic valve regurgitation is not visualized. Mild aortic valve sclerosis is present, with no evidence of aortic valve stenosis. There is mild thickening of the aortic valve. There is mild calcification of the aortic valve. Pulmonic Valve: The pulmonic valve was normal in structure. Pulmonic valve regurgitation is not visualized. No evidence of pulmonic stenosis. Aorta: The aortic root is normal in size and structure. Venous: The inferior vena cava is normal in size with greater than 50% respiratory variability, suggesting right atrial pressure of 3 mmHg. IAS/Shunts: No atrial level shunt detected by color flow Doppler.  LEFT VENTRICLE PLAX 2D LVIDd:         4.00 cm  Diastology LVIDs:         2.90 cm  LV e' lateral:   10.60 cm/s LV PW:         0.90 cm  LV E/e' lateral: 5.7 LV IVS:        0.80 cm  LV e' medial:    10.90 cm/s LVOT diam:     2.10 cm  LV E/e' medial:  5.5 LV SV:         53 LV SV Index:   32 LVOT Area:     3.46 cm  RIGHT VENTRICLE RV S prime:     9.03 cm/s TAPSE (M-mode): 1.8 cm LEFT ATRIUM           Index       RIGHT ATRIUM           Index LA diam:      3.00 cm 1.82 cm/m  RA Area:     8.76 cm LA Vol (A2C): 30.9 ml 18.75 ml/m RA Volume:   18.80 ml 11.41 ml/m LA Vol (A4C): 18.7 ml 11.35 ml/m  AORTIC VALVE LVOT Vmax:   77.30 cm/s LVOT Vmean:  54.900 cm/s LVOT VTI:    0.152 m  AORTA Ao Root diam: 2.90 cm Ao Asc diam:  2.60 cm MITRAL VALVE MV Area (PHT): 5.13 cm    SHUNTS MV Decel Time: 148 msec    Systemic VTI:  0.15 m MV E velocity: 60.30 cm/s  Systemic Diam: 2.10 cm MV A velocity: 51.10 cm/s MV E/A ratio:  1.18 Armanda Magicraci Turner MD Electronically signed by Armanda Magicraci Turner MD Signature Date/Time: 10/28/2019/10:24:45 AM    Final     Patient Profile     24 y.o. male with former tobacco abuse who reports history of intermittent sharp chest pain. He recently developed fever and headache on 9/2 (temp 101.5). He did a rapid Covid test at home which was negative then had a repeat test in the ED 9/4 which was negative. He then developed chest pain and acute diffuse ST elevation along with elevated troponin. Clinical scenario felt concerning for myopericarditis. Cardiac MRI was felt consistent with myopericarditis.  Echocardiogram shows normal LV function and no pericardial effusion.  Had 1 Covid vaccine in July.  Assessment & Plan    1 acute myocarditis/pericarditis-symptoms much improved this morning.  We will plan to complete 14-day course of ibuprofen.  Continue colchicine for for 3 months and then discontinue.  2 elevated liver functions-likely viral etiology (associated with acute myocarditis/pericarditis).  Will need to recheck as an outpatient.  Discharge today on present medications.  Plan follow-up with APP in 2 to 4 weeks.  Follow-up Dr. Mayford Knifeurner 3 months.  Greater than 30 minutes p.m. physician time. D2  For questions or updates, please contact [facility] Please consult [email or web address] for contact info under        Signed, Olga MillersBrian Snyder Colavito, MD  10/29/2019, 9:12 AM

## 2019-10-29 NOTE — Plan of Care (Signed)

## 2019-10-29 NOTE — Progress Notes (Signed)
D/C instructions given and reviewed. No questions voiced but encouraged to call with any questions. Tele and IV removed, tolerated well.

## 2019-10-29 NOTE — TOC Transition Note (Signed)
Transition of Care [facility]) - CM/SW Discharge Note   Patient Details  Name: Richard Richmond MRN: [id number] Date of Birth: 1996-01-19  Transition of Care [facility]) CM/SW Contact:  Leone Haven, RN Phone Number: 10/29/2019, 10:57 AM   Clinical Narrative:    Patient is for dc today, he has a follow up apt with [facility], NCM assisted with meds with [employer], TOC will fill meds for him.  He has transportation at discharge. He has no other needs.    Final next level of care: Home/Self Care Barriers to Discharge: No Barriers Identified   Patient Goals and CMS Choice Patient states their goals for this hospitalization and ongoing recovery are:: get better   Choice offered to / list presented to : NA  Discharge Placement                       Discharge Plan and Services                  DME Agency: NA       HH Arranged: NA          Social Determinants of Health (SDOH) Interventions     Readmission Risk Interventions No flowsheet data found.

## 2019-10-29 NOTE — Discharge Summary (Addendum)
Discharge Summary    Patient ID: RICARDO SCHUBACH MRN: [id number]; DOB: 1995/03/25  Admit date: 10/27/2019 Discharge date: 10/29/2019  Primary Care Provider: Patient, No Pcp Per  Primary Cardiologist: Armanda Magic, MD  Primary Electrophysiologist:  None   Discharge Diagnoses    Principal Problem:   Myopericarditis Active Problems:   Elevated liver function tests   Thrombocytopenia - transient, resolved   Diagnostic Studies/Procedures    2D Echo 10/28/19 1. Left ventricular ejection fraction, by estimation, is 55 to 60%. The  left ventricle has normal function.  2. Right ventricular systolic function is normal. The right ventricular  size is normal. Tricuspid regurgitation signal is inadequate for assessing  PA pressure.  3. The mitral valve is normal in structure. Trivial mitral valve  regurgitation. No evidence of mitral stenosis.  4. The aortic valve is tricuspid. Aortic valve regurgitation is not  visualized. Mild aortic valve sclerosis is present, with no evidence of  aortic valve stenosis.  5. The inferior vena cava is normal in size with greater than 50%  respiratory variability, suggesting right atrial pressure of 3 mmHg.  _____________   History of Present Illness     Richard Richmond is a 24 y.o. male with former tobacco abuse who reports history of intermittent sharp chest pain for several months' time. He had 1 Covid vaccine back in July. He recently developed fever and headache on 10/23/19 (temp 101.5). He did a rapid Covid test at home which was negative then had a repeat test in the ED 9/4 which was negative. He then developed worsening chest pain so presented back to the ED on 10/27/19. EKG showed diffuse ST elevation (new from 9/4) felt consistent with pericarditis. CTA was negative for PE. His troponin was elevated, concerning for myopericarditis, and cardiology was asked to admit. Repeat Covid test negative in ED.   Hospital Course     1. Chest pain with  elevated troponin felt consistent with myopericarditis - cMRI showed "findings consistent with myopericarditis with trivial to small pericardial effusion. Marked hyper-enhancement of the apical and RV free wall pericardium with small amount of myocardial uptake in the distal septum and apex consistent with myopericarditis Area corresponds to RWMA with mild distal septal and apical hypokinesis. Overall preserved EF 56% with mild distal septal and apical hypokinesis." - troponin trend [phone] - 2D echo 10/28/19 reassuring, normal LVEF - he was started on colchicine/ibuprofen/PPI for the component of pericarditis - per MD he will complete 14 total days of ibuprofen, 3 months of colchicine (dosed at 0.6mg  DAILY instead of BID given hepatic dysfunction), and will remain on PPI while on ibuprofen - per Dr. Jens Som he will need to refrain from sports for at least 3 months until cleared by cardiologist but otherwise Dr. Jens Som is OK with returning to work in 1 week (activity instructions outlined below, work note given)  2. Abnormal transaminases - LFTs noted to be elevated, AST/ALT peak 586/ALT 777 as of today - Dr. Jens Som feels related to acute inflammation and process of myopericarditis and has cleared patient for treatment with ibuprofen/colchicine as outlined above - he recommends close f/u repeat CMET on Monday with labs shared with Dr. Mayford Knife - pt advised to avoid acetaminophen/ETOH  3. Mild thrombocytopenia - plt count 160->135 yesterday -> 170, resolved  4. Hyperglycemia - glucose mildly elevated this admission, but A1C wnl  The patient feels well today. Dr. Jens Som has seen and examined the patient today and feels he is stable for discharge. TOC f/u  has been requested. [facility] and care management will be involved given that he does not have any insurance. It also appears the unit has made him a primary care appointment with [facility]  as well.   Did the patient have an acute coronary syndrome (MI, NSTEMI, STEMI, etc) this admission?:  No.   The elevated troponin was due to the myopericarditis. _____________  Discharge Vitals Blood pressure (!) 101/59, pulse 86, temperature 98.4 F (36.9 C), temperature source Oral, resp. rate 16, height 5\' 6"  (1.676 m), weight 56.8 kg, SpO2 100 %.  Filed Weights   10/27/19 1534 10/27/19 2353 10/29/19 0105  Weight: 72.6 kg 57.5 kg 56.8 kg    Labs & Radiologic Studies    CBC Recent Labs    10/27/19 1624 10/29/19 0401  WBC 4.5 4.5  HGB 14.4 13.8  HCT 41.8 41.3  MCV 82.0 82.4  PLT 135* 170   Basic Metabolic Panel Recent Labs    16/11/9607/07/21 0155 10/29/19 0401  NA 136 137  K 3.5 3.9  CL 98 97*  CO2 30 29  GLUCOSE 148* 94  BUN 10 7  CREATININE 0.85 0.74  CALCIUM 9.1 9.4   Liver Function Tests Recent Labs    10/28/19 0155 10/29/19 0401  AST 399* 586*  ALT 330* 777*  ALKPHOS 95 103  BILITOT 1.1 1.0  PROT 6.8 7.3  ALBUMIN 3.3* 3.5   Recent Labs    10/27/19 1624  LIPASE 23   High Sensitivity Troponin:   Recent Labs  Lab 10/25/19 1238 10/27/19 1624 10/27/19 1732 10/28/19 0155 10/29/19 0401  TROPONINIHS 3 3,688* 4,574* 6,819* 5,830*    BNP Invalid input(s): POCBNP D-Dimer Recent Labs    10/27/19 1624  DDIMER 2.05*   Hemoglobin A1C Recent Labs    10/29/19 0740  HGBA1C 5.4   Fasting Lipid Panel No results for input(s): CHOL, HDL, LDLCALC, TRIG, CHOLHDL, LDLDIRECT in the last 72 hours. Thyroid Function Tests Recent Labs    10/28/19 0155  TSH 1.493   _____________  DG Chest 2 View  Result Date: 10/27/2019 CLINICAL DATA:  Chest pain and shortness of breath. EXAM: CHEST - 2 VIEW COMPARISON:  October 25, 2019 FINDINGS: Cardiomediastinal silhouette is normal. Mediastinal contours appear intact. There is no evidence of focal airspace consolidation, pleural effusion or pneumothorax. Osseous structures are without acute abnormality. Soft tissues  are grossly normal. IMPRESSION: No active cardiopulmonary disease. Electronically Signed   By: Ted Mcalpineobrinka  Dimitrova M.D.   On: 10/27/2019 15:49   DG Chest 2 View  Result Date: 10/25/2019 CLINICAL DATA:  Chest pain for 2 months.  Fever. EXAM: CHEST - 2 VIEW COMPARISON:  None. FINDINGS: Midline trachea.  Normal heart size and mediastinal contours. Sharp costophrenic angles.  No pneumothorax.  Clear lungs. IMPRESSION: No active cardiopulmonary disease. Electronically Signed   By: Jeronimo GreavesKyle  Talbot M.D.   On: 10/25/2019 12:00   CT Angio Chest PE W and/or Wo Contrast  Result Date: 10/27/2019 CLINICAL DATA:  Chest pain, chest pressure EXAM: CT ANGIOGRAPHY CHEST WITH CONTRAST TECHNIQUE: Multidetector CT imaging of the chest was performed using the standard protocol during bolus administration of intravenous contrast. Multiplanar CT image reconstructions and MIPs were obtained to evaluate the vascular anatomy. CONTRAST:  80mL OMNIPAQUE IOHEXOL 350 MG/ML SOLN COMPARISON:  None. FINDINGS: Cardiovascular: Satisfactory opacification of the pulmonary arteries to the segmental level. No evidence of pulmonary embolism. Normal heart size. No pericardial effusion. The thoracic aorta is unremarkable  Mediastinum/Nodes: No enlarged mediastinal, hilar, or axillary lymph nodes. Thyroid gland, trachea, and esophagus demonstrate no significant findings. Residual thymic tissue seen within the anterior mediastinum. Lungs/Pleura: Lungs are clear. No pleural effusion or pneumothorax. Central airways are widely patent. Upper Abdomen: No acute abnormality. Musculoskeletal: No acute bone abnormality Review of the MIP images confirms the above findings. IMPRESSION: Negative for pulmonary embolism or other acute intrathoracic process. Electronically Signed   By: Helyn Numbers MD   On: 10/27/2019 17:55   MR CARDIAC MORPHOLOGY W WO CONTRAST  Result Date: 10/28/2019 CLINICAL DATA:  Myopericarditis EXAM: CARDIAC MRI TECHNIQUE: The patient was  scanned on a 1.5 Tesla GE magnet. A dedicated cardiac coil was used. Functional imaging was done using Fiesta sequences. 2,3, and 4 chamber views were done to assess for RWMA's. Modified Simpson's rule using a short axis stack was used to calculate an ejection fraction on a dedicated work [employer]. The patient received 6 cc of Gadavist. After 10 minutes inversion recovery sequences were used to assess for infiltration and scar tissue. CONTRAST:  ZOXWRUEA FINDINGS: Normal cardiac chamber sizes. Normal aortic root. Normal cardiac valves. Normal RV size and function. Normal LV size mild distal septal and apical hypokinesis quantitative EF 56% (EDV 115 cc ESV 51 cc SV 64 cc) Septal thickness 8 mm normal coronary artery origins no anomaly There is a trivial / small pericardial effusion along the RV free wall, apex and posterior LV. There is marked hyper-enhancement of the apical and RV free wall pericardium with a small area of myocardial uptake int he distal septum and apex IMPRESSION: 1. Findings consistent with myopericarditis with trivial to small pericardial effusion. Marked hyper-enhancement of the apical and RV free wall pericardium with small amount of myocardial uptake in the distal septum and apex consistent with myopericarditis Area corresponds to RWMA with mild distal septal and apical hypokinesis 2. Overall preserved EF 56% with mild distal septal and apical hypokinesis 3.  Normal coronary artery origins no anomaly 4.  Normal RV size and function 5.  Normal cardiac valves 6.  Normal aortic root 7.  No PFO/ASD/VSD Charlton Haws Electronically Signed   By: Charlton Haws M.D.   On: 10/28/2019 08:51   ECHOCARDIOGRAM COMPLETE  Result Date: 10/28/2019    ECHOCARDIOGRAM REPORT   Patient Name:   Richard Richmond Date of Exam: 10/28/2019 Medical Rec #:  [id number]        Height:       66.0 in Accession #:    [id number]       Weight:       126.8 lb Date of Birth:  [birth year]        BSA:           1.648 m Patient Age:    24 years         BP:           117/59 mmHg Patient Gender: M                HR:           80 bpm. Exam Location:  Inpatient Procedure: 2D Echo, Cardiac Doppler and Color Doppler Indications:    Chest Pain 786.50 / R07.9  History:        Patient has no prior history of Echocardiogram examinations.                 Signs/Symptoms:Chest Pain.  Sonographer:    Eulah Pont RDCS Referring Phys: [phone] RHONDA  G BARRETT IMPRESSIONS  1. Left ventricular ejection fraction, by estimation, is 55 to 60%. The left ventricle has normal function.  2. Right ventricular systolic function is normal. The right ventricular size is normal. Tricuspid regurgitation signal is inadequate for assessing PA pressure.  3. The mitral valve is normal in structure. Trivial mitral valve regurgitation. No evidence of mitral stenosis.  4. The aortic valve is tricuspid. Aortic valve regurgitation is not visualized. Mild aortic valve sclerosis is present, with no evidence of aortic valve stenosis.  5. The inferior vena cava is normal in size with greater than 50% respiratory variability, suggesting right atrial pressure of 3 mmHg. FINDINGS  Left Ventricle: Left ventricular ejection fraction, by estimation, is 55 to 60%. The left ventricle has normal function. The left ventricle has no regional wall motion abnormalities. The left ventricular internal cavity size was normal in size. There is  no left ventricular hypertrophy. Left ventricular diastolic parameters were normal. Normal left ventricular filling pressure. Right Ventricle: The right ventricular size is normal. No increase in right ventricular wall thickness. Right ventricular systolic function is normal. Tricuspid regurgitation signal is inadequate for assessing PA pressure. Left Atrium: Left atrial size was normal in size. Right Atrium: Right atrial size was normal in size. Pericardium: There is no evidence of pericardial effusion. Mitral Valve: The mitral valve is  normal in structure. Normal mobility of the mitral valve leaflets. Trivial mitral valve regurgitation. No evidence of mitral valve stenosis. Tricuspid Valve: The tricuspid valve is normal in structure. Tricuspid valve regurgitation is trivial. No evidence of tricuspid stenosis. Aortic Valve: The aortic valve is tricuspid. . There is mild thickening and mild calcification of the aortic valve. Aortic valve regurgitation is not visualized. Mild aortic valve sclerosis is present, with no evidence of aortic valve stenosis. There is mild thickening of the aortic valve. There is mild calcification of the aortic valve. Pulmonic Valve: The pulmonic valve was normal in structure. Pulmonic valve regurgitation is not visualized. No evidence of pulmonic stenosis. Aorta: The aortic root is normal in size and structure. Venous: The inferior vena cava is normal in size with greater than 50% respiratory variability, suggesting right atrial pressure of 3 mmHg. IAS/Shunts: No atrial level shunt detected by color flow Doppler.  LEFT VENTRICLE PLAX 2D LVIDd:         4.00 cm  Diastology LVIDs:         2.90 cm  LV e' lateral:   10.60 cm/s LV PW:         0.90 cm  LV E/e' lateral: 5.7 LV IVS:        0.80 cm  LV e' medial:    10.90 cm/s LVOT diam:     2.10 cm  LV E/e' medial:  5.5 LV SV:         53 LV SV Index:   32 LVOT Area:     3.46 cm  RIGHT VENTRICLE RV S prime:     9.03 cm/s TAPSE (M-mode): 1.8 cm LEFT ATRIUM           Index       RIGHT ATRIUM          Index LA diam:      3.00 cm 1.82 cm/m  RA Area:     8.76 cm LA Vol (A2C): 30.9 ml 18.75 ml/m RA Volume:   18.80 ml 11.41 ml/m LA Vol (A4C): 18.7 ml 11.35 ml/m  AORTIC VALVE LVOT Vmax:   77.30 cm/s LVOT Vmean:  54.900 cm/s LVOT VTI:    0.152 m  AORTA Ao Root diam: 2.90 cm Ao Asc diam:  2.60 cm MITRAL VALVE MV Area (PHT): 5.13 cm    SHUNTS MV Decel Time: 148 msec    Systemic VTI:  0.15 m MV E velocity: 60.30 cm/s  Systemic Diam: 2.10 cm MV A velocity: 51.10 cm/s MV E/A ratio:  1.18  Armanda Magic MD Electronically signed by Armanda Magic MD Signature Date/Time: 10/28/2019/10:24:45 AM    Final    Disposition   Pt is being discharged home today in good condition.  Follow-up Plans & Appointments     Follow-up Information    [facility]. Go on 11/26/2019.   Why: :50am Contact information: [address] [place] [address] [phone]       [facility] [address] Office Follow up.   Specialty: Cardiology Why: [facility] [address] location - please come to the office on Monday November 03, 2019 for labwork. This is scheduled for 1:45 PM. Please arrive 10 minutes early to check in. You do not need to be fasting. Contact information: [address], [address] [place] [address] [phone]       [facility], [place] Follow up.   Specialty: Cardiology Why: [facility] location - a follow-up has been scheduled for you on Tuesday November 11, 2019 at 2:15 PM (Arrive by 2:00 PM). Vin is one of our PAs with our cardiology team. Contact information: [address] [place] [address] [phone]              Discharge Instructions    Diet - low sodium heart healthy   Complete by: As directed    Discharge instructions   Complete by: As directed    Treatment plan: - Continue ibuprofen for the amount dispensed to complete 14 full days of treatment which includes the doses you got in the hospital (this is scheduled, NOT just as-needed). Take this with food. If you notice any stomach upset or signs of gastrointestinal bleeding such as blood in stool or black stools, stop this medicine and call your doctor immediately - Take pantoprazole to protect your stomach while taking ibuprofen. - Continue colchicine for 3 months. When you are coming due for your refill, contact your regular pharmacy to have your prescription transferred from the hospital (there  will be 2 refills).  Your liver function was elevated while you were in the hospital. Dr. Jens Som suspects this was related to the same process causing your heart inflammation. Do not take any Tylenol (acetaminophen) or products containing this medicine and avoid alcohol until otherwise directed by your doctor.   Increase activity slowly   Complete by: As directed    No driving for 1 week. No lifting over 10 lbs for 2 weeks. No sexual activity for 2 weeks. You may return to work in 1 week if feeling well (11/05/19). Avoid sports or heavy physical exertion for at least 3 months - you must discuss when to return with your cardiologist.      Discharge Medications   Allergies as of 10/29/2019   No Known Allergies     Medication List    STOP taking these medications   acetaminophen 500 MG tablet Commonly known as: TYLENOL     TAKE these medications   colchicine 0.6 MG tablet Take 1 tablet (0.6 mg total) by mouth daily. For 3 months total. Start  taking on: October 30, 2019   ibuprofen 800 MG tablet Commonly known as: ADVIL Take 1 tablet (800 mg total) by mouth every 8 (eight) hours.   pantoprazole 40 MG tablet Commonly known as: PROTONIX Take 1 tablet (40 mg total) by mouth daily. While taking ibuprofen.          Outstanding Labs/Studies   CMET on 11/03/19  Duration of Discharge Encounter   Greater than 30 minutes including physician time.  Signed, Laurann Montana, PA-C 10/29/2019, 10:55 AM

## 2019-10-30 NOTE — Telephone Encounter (Signed)
Patient contacted regarding discharge from [facility] on 10/29/19  Patient understands to follow up with provider Yes.  Glorious Peach, PA on 11/11/19 at 2:15 and lab work on 11/03/19.  He is aware both appointments are at [facility]. Patient understands discharge instructions? yes Patient understands medications and regiment?  yes Patient understands to bring all medications to this visit? yes  Ask patient:  Are you enrolled in My Chart -No  He has information on discharge instructions on how to sign up for my chart and does not need assistance signing up

## 2019-11-01 LAB — CULTURE, BLOOD (ROUTINE X 2)
Culture: NO GROWTH
Culture: NO GROWTH
Special Requests: ADEQUATE

## 2019-11-03 ENCOUNTER — Other Ambulatory Visit: Payer: Self-pay | Admitting: *Deleted

## 2019-11-03 ENCOUNTER — Other Ambulatory Visit: Payer: Self-pay

## 2019-11-03 DIAGNOSIS — R7989 Other specified abnormal findings of blood chemistry: Secondary | ICD-10-CM

## 2019-11-04 LAB — COMPREHENSIVE METABOLIC PANEL
ALT: 335 IU/L — ABNORMAL HIGH (ref 0–44)
AST: 58 IU/L — ABNORMAL HIGH (ref 0–40)
Albumin/Globulin Ratio: 1.3 (ref 1.2–2.2)
Albumin: 4.6 g/dL (ref 4.1–5.2)
Alkaline Phosphatase: 122 IU/L — ABNORMAL HIGH (ref 44–121)
BUN/Creatinine Ratio: 17 (ref 9–20)
BUN: 13 mg/dL (ref 6–20)
Bilirubin Total: 0.3 mg/dL (ref 0.0–1.2)
CO2: 26 mmol/L (ref 20–29)
Calcium: 10.1 mg/dL (ref 8.7–10.2)
Chloride: 99 mmol/L (ref 96–106)
Creatinine, Ser: 0.78 mg/dL (ref 0.76–1.27)
GFR calc Af Amer: 145 mL/min/{1.73_m2} (ref >59)
GFR calc non Af Amer: 125 mL/min/{1.73_m2} (ref >59)
Globulin, Total: 3.5 g/dL (ref 1.5–4.5)
Glucose: 72 mg/dL (ref 65–99)
Potassium: 4.6 mmol/L (ref 3.5–5.2)
Sodium: 142 mmol/L (ref 134–144)
Total Protein: 8.1 g/dL (ref 6.0–8.5)

## 2019-11-11 ENCOUNTER — Encounter: Payer: Self-pay | Admitting: Physician Assistant

## 2019-11-11 ENCOUNTER — Ambulatory Visit (INDEPENDENT_AMBULATORY_CARE_PROVIDER_SITE_OTHER): Payer: Self-pay | Admitting: Physician Assistant

## 2019-11-11 ENCOUNTER — Other Ambulatory Visit: Payer: Self-pay

## 2019-11-11 VITALS — BP 108/62 | HR 89 | Ht 66.0 in | Wt 129.0 lb

## 2019-11-11 DIAGNOSIS — I319 Disease of pericardium, unspecified: Secondary | ICD-10-CM

## 2019-11-11 DIAGNOSIS — R7989 Other specified abnormal findings of blood chemistry: Secondary | ICD-10-CM

## 2019-11-11 NOTE — Progress Notes (Signed)
Cardiology Office Note    Date:  11/11/2019   ID:  Richard Richmond, DOB 12-29-1995, MRN [id number]  PCP:  Patient, No Pcp Per  Cardiologist:  Dr. Mayford Knife Chief Complaint: Hospital follow up   History of Present Illness:   Richard Richmond is a 24 y.o. male with recent dx of myopericarditis presents for follow up.   He had 1 Covid vaccine back in July. Due to recurrent chest pain, fever and headache came to ER 10/2019. COVID negative. EKG showed diffuse ST elevation (new from 9/4) felt consistent with pericarditis. CTA was negative for PE. cMRI showed "findings consistent with myopericarditis with trivial to small pericardial effusion. Marked hyper-enhancement of the apical and RV free wall pericardium with small amount of myocardial uptake in the distal septum and apex consistent with myopericarditis Area corresponds to RWMA with mild distal septal and apical hypokinesis. Overall preserved EF 56% with mild distal septal and apical hypokinesis." Echo with normal LVEF. Plan for ibuprofen for 14 days and 3 months of colchicine 0.6mg  qd due to hepatic dysfunction. per Dr. Jens Som he will need to refrain from sports for at least 3 months until cleared by cardiologist but otherwise Dr. Jens Som is OK with returning to work in 1 week. LFTS improved on outpatient check.  Here today for follow-up.  Still having chest discomfort intermittently.  Noticed chest pain especially with while laying down and bending.  No chest discomfort with laying down.  No bleeding issue.  No chest pain, palpitation, orthopnea, PND, syncope or lower extremity edema.  He worked less than 20 hours last week and did okay.   Past Medical History:  Diagnosis Date  . Abnormal liver function test    in context of myopericarditis  . Myopericarditis     History reviewed. No pertinent surgical history.  Current Medications: Prior to Admission medications   Medication Sig Start Date End Date Taking? Authorizing Provider    colchicine 0.6 MG tablet Take 1 tablet (0.6 mg total) by mouth daily. For 3 months total. 10/30/19   Lewayne Bunting, MD  ibuprofen (ADVIL) 800 MG tablet Take 1 tablet (800 mg total) by mouth every 8 (eight) hours. 10/29/19   Lewayne Bunting, MD  pantoprazole (PROTONIX) 40 MG tablet Take 1 tablet (40 mg total) by mouth daily. While taking ibuprofen. 10/29/19   Lewayne Bunting, MD    Allergies:   Patient has no known allergies.   Social History   Socioeconomic History  . Marital status: Single    Spouse name: Not on file  . Number of children: Not on file  . Years of education: Not on file  . Highest education level: Not on file  Occupational History  . Not on file  Tobacco Use  . Smoking status: Former Games developer  . Smokeless tobacco: Former Clinical biochemist  . Vaping Use: Never used  Substance and Sexual Activity  . Alcohol use: Not Currently  . Drug use: Never  . Sexual activity: Yes    Birth control/protection: Condom  Other Topics Concern  . Not on file  Social History Narrative  . Not on file   Social Determinants of Health   Financial Resource Strain:   . Difficulty of Paying Living Expenses: Not on file  Food Insecurity:   . Worried About Programme researcher, broadcasting/film/video in the Last Year: Not on file  . Ran Out of Food in the Last Year: Not on file  Transportation Needs:   .  Lack of Transportation (Medical): Not on file  . Lack of Transportation (Non-Medical): Not on file  Physical Activity:   . Days of Exercise per Week: Not on file  . Minutes of Exercise per Session: Not on file  Stress:   . Feeling of Stress : Not on file  Social Connections:   . Frequency of Communication with Friends and Family: Not on file  . Frequency of Social Gatherings with Friends and Family: Not on file  . Attends Religious Services: Not on file  . Active Member of Clubs or Organizations: Not on file  . Attends Banker Meetings: Not on file  . Marital Status: Not on file      Family History:  The patient's family history includes Healthy in his father and mother.   ROS:   Please see the history of present illness.    ROS All other systems reviewed and are negative.   PHYSICAL EXAM:   VS:  BP 108/62   Pulse 89   Ht 5\' 6"  (1.676 m)   Wt 129 lb (58.5 kg)   SpO2 99%   BMI 20.82 kg/m    GEN: Well nourished, well developed, in no acute distress  HEENT: normal  Neck: no JVD, carotid bruits, or masses Cardiac: RRR; no murmurs, rubs, or gallops,no edema  Respiratory:  clear to auscultation bilaterally, normal work of breathing GI: soft, nontender, nondistended, + BS MS: no deformity or atrophy  Skin: warm and dry, no rash Neuro:  Alert and Oriented x 3, Strength and sensation are intact Psych: euthymic mood, full affect  Wt Readings from Last 3 Encounters:  11/11/19 129 lb (58.5 kg)  10/29/19 125 lb 3.5 oz (56.8 kg)  10/25/19 130 lb (59 kg)      Studies/Labs Reviewed:   EKG:  EKG is not ordered today.    Recent Labs: 10/27/2019: B Natriuretic Peptide 18.2 10/28/2019: TSH 1.493 10/29/2019: Hemoglobin 13.8; Platelets 170 11/03/2019: ALT 335; BUN 13; Creatinine, Ser 0.78; Potassium 4.6; Sodium 142   Lipid Panel No results found for: CHOL, TRIG, HDL, CHOLHDL, VLDL, LDLCALC, LDLDIRECT  Additional studies/ records that were reviewed today include:   Echocardiogram: 10/28/19 1. Left ventricular ejection fraction, by estimation, is 55 to 60%. The  left ventricle has normal function.  2. Right ventricular systolic function is normal. The right ventricular  size is normal. Tricuspid regurgitation signal is inadequate for assessing  PA pressure.  3. The mitral valve is normal in structure. Trivial mitral valve  regurgitation. No evidence of mitral stenosis.  4. The aortic valve is tricuspid. Aortic valve regurgitation is not  visualized. Mild aortic valve sclerosis is present, with no evidence of  aortic valve stenosis.  5. The inferior vena cava is  normal in size with greater than 50%  respiratory variability, suggesting right atrial pressure of 3 mmHg.      ASSESSMENT & PLAN:    1. Myopericarditis He still has pleuritic symptoms but improved with symptoms. Reviewed with Dr. 12/28/19. Continue 2 more week of ibuprofen and protonix. Continue Colchicine for total 3 months. He will call Mayford Knife if worsening of symptoms.   2.  Transaminitis -Improved on last check but not normal. -We will recheck labs   Medication Adjustments/Labs and Tests Ordered: Current medicines are reviewed at length with the patient today.  Concerns regarding medicines are outlined above.  Medication changes, Labs and Tests ordered today are listed in the Patient Instructions below. Patient Instructions  Medication Instructions:  Your physician  recommends that you continue on your current medications as directed. Please refer to the Current Medication list given to you today.  Continue the Ibuprofen and Protonix for 2 more weeks  *If you need a refill on your cardiac medications before your next appointment, please call your pharmacy*   Lab Work: TODAY:  LFT  If you have labs (blood work) drawn today and your tests are completely normal, you will receive your results only by: Marland Kitchen MyChart Message (if you have MyChart) OR . A paper copy in the mail If you have any lab test that is abnormal or we need to change your treatment, we will call you to review the results.   Testing/Procedures: None ordered   Follow-Up: At [facility], you and your health needs are our priority.  As part of our continuing mission to provide you with exceptional heart care, we have created designated Provider Care Teams.  These Care Teams include your primary Cardiologist (physician) and Advanced Practice Providers (APPs -  Physician Assistants and Nurse Practitioners) who all work together to provide you with the care you need, when you need it.  We recommend signing up for the  patient portal called "MyChart".  Sign up information is provided on this After Visit Summary.  MyChart is used to connect with patients for Virtual Visits (Telemedicine).  Patients are able to view lab/test results, encounter notes, upcoming appointments, etc.  Non-urgent messages can be sent to your provider as well.   To learn more about what you can do with MyChart, go to [email or web address].    Your next appointment:   2-3 month(s)  01/27/2020 3:00   The format for your next appointment:   In Person  Provider:   Armanda Magic, MD   Other Instructions      Signed, Manson Passey, PA  11/11/2019 2:32 PM    [facility] [address] [place], [place], [place]  [address] Phone: [phone]; Fax: [phone]

## 2019-11-11 NOTE — Patient Instructions (Addendum)
Medication Instructions:  Your physician recommends that you continue on your current medications as directed. Please refer to the Current Medication list given to you today.  Continue the Ibuprofen and Protonix for 2 more weeks  *If you need a refill on your cardiac medications before your next appointment, please call your pharmacy*   Lab Work: TODAY:  LFT  If you have labs (blood work) drawn today and your tests are completely normal, you will receive your results only by: Marland Kitchen MyChart Message (if you have MyChart) OR . A paper copy in the mail If you have any lab test that is abnormal or we need to change your treatment, we will call you to review the results.   Testing/Procedures: None ordered   Follow-Up: At [facility], you and your health needs are our priority.  As part of our continuing mission to provide you with exceptional heart care, we have created designated Provider Care Teams.  These Care Teams include your primary Cardiologist (physician) and Advanced Practice Providers (APPs -  Physician Assistants and Nurse Practitioners) who all work together to provide you with the care you need, when you need it.  We recommend signing up for the patient portal called "MyChart".  Sign up information is provided on this After Visit Summary.  MyChart is used to connect with patients for Virtual Visits (Telemedicine).  Patients are able to view lab/test results, encounter notes, upcoming appointments, etc.  Non-urgent messages can be sent to your provider as well.   To learn more about what you can do with MyChart, go to [email or web address].    Your next appointment:   2-3 month(s)  01/27/2020 3:00   The format for your next appointment:   In Person  Provider:   Armanda Magic, MD   Other Instructions

## 2019-11-12 LAB — HEPATIC FUNCTION PANEL
ALT: 57 IU/L — ABNORMAL HIGH (ref 0–44)
AST: 29 IU/L (ref 0–40)
Albumin: 5.1 g/dL (ref 4.1–5.2)
Alkaline Phosphatase: 97 IU/L (ref 44–121)
Bilirubin Total: 0.4 mg/dL (ref 0.0–1.2)
Bilirubin, Direct: 0.13 mg/dL (ref 0.00–0.40)
Total Protein: 8.4 g/dL (ref 6.0–8.5)

## 2019-11-19 NOTE — Progress Notes (Signed)
Patient ID: Richard Richmond, male   DOB: May 29, 1995, 24 y.o.   MRN: [id number]   Virtual Visit via Telephone Note  I connected with Estell Harpin on 11/26/19 at 10:50 AM EDT by telephone and verified that I am speaking with the correct person using two identifiers.   I discussed the limitations, risks, security and privacy concerns of performing an evaluation and management service by telephone and the availability of in person appointments. I also discussed with the patient that there may be a patient responsible charge related to this service. The patient expressed understanding and agreed to proceed.  PATIENT visit by telephone virtually in the context of Covid-19 pandemic. Patient location:  home My Location:  [facility] office Persons on the call:  Me, the patient, roomed by Carolynne Edouard   History of Present Illness: After hospitalization 9/6-10/29/2019 with Myopericarditis.  Saw cardiology 11/11/2019.  He is prescribed colchicine for 3 months.  Still having occasional CP thatis relieved by ibuprofen.  No SOB.  Energy levels improving.  No abdominal pain.  Labs almost back at baseline including LFT 11/11/19.  Quit drinking alcohol and smoking about 2 months ago.  No abdominal pain.  Appetite is good.    From discharge summary: Principal Problem:   Myopericarditis Active Problems:   Elevated liver function tests   Thrombocytopenia - transient, resolved  Richard Richmond is a 24 y.o. male with former tobacco abuse whoreports history of intermittent sharp chest pain for several months' time. He had 1 Covid vaccine back in July. Herecently developed fever and headache on 10/23/19 (temp 101.5). He did a rapid Covid test at home which was negative then had a repeat test in the ED 9/4 which was negative. He then developed worsening chest pain so presented back to the ED on 10/27/19. EKG showed diffuse ST elevation (new from 9/4) felt consistent with pericarditis. CTA was negative for PE. His troponin  was elevated, concerning for myopericarditis, and cardiology was asked to admit. Repeat Covid test negative in ED.   1. Chest pain with elevated troponin felt consistent with myopericarditis - cMRI showed "findings consistent with myopericarditis with trivial to small pericardial effusion. Marked hyper-enhancement of the apical and RV free wall pericardium with small amount of myocardial uptake in the distal septum and apex consistent with myopericarditis Area corresponds to RWMA with mild distal septal and apical hypokinesis. Overall preserved EF 56% with mild distal septal and apical hypokinesis." - troponin trend [phone] - 2D echo 10/28/19 reassuring, normal LVEF - he was started on colchicine/ibuprofen/PPI for the component of pericarditis - per MD he will complete 14 total days of ibuprofen, 3 months of colchicine (dosed at 0.6mg  DAILY instead of BID given hepatic dysfunction), and will remain on PPI while on ibuprofen - per Dr. Jens Som he will need to refrain from sports for at least 3 months until cleared by cardiologist but otherwise Dr. Jens Som is OK with returning to work in 1 week (activity instructions outlined below, work note given)  2. Abnormal transaminases -LFTs noted to be elevated, AST/ALT peak 586/ALT 777 as of today - Dr. Jens Som feels related to acute inflammation and process of myopericarditis and has cleared patient for treatment with ibuprofen/colchicine as outlined above - he recommends close f/u repeat CMET on Monday with labs shared with Dr. Mayford Knife - pt advised to avoid acetaminophen/ETOH  3. Mild thrombocytopenia - plt count 160->135 yesterday -> 170, resolved  4. Hyperglycemia - glucose mildly elevated this admission, but A1C wnl  From 11/11/2019 cardiology note:  1. Myopericarditis He still has pleuritic symptoms but improved with symptoms. Reviewed with Dr. Mayford Knife. Continue 2 more week of ibuprofen and protonix. Continue Colchicine for total 3  months. He will call us if worsening of symptoms.   2.  Transaminitis -Improved on last check but not normal. -We will recheck labs    Observations/Objective: NAD.  A&Ox3.    Assessment and Plan: 1. Myopericarditis Continue colchicine and can drop down to prn on ibuprofen bc finished course - ibuprofen (ADVIL) 800 MG tablet; Take 1 tablet (800 mg total) by mouth every 8 (eight) hours. Prn pain with food  Dispense: 40 tablet; Refill: 0  2. Elevated liver function tests Resolving/back at baseline  3. Hospital discharge follow-up    Follow Up Instructions: Assign PCP in 6-8 weeks;  Keep f/up cardiology appts   I discussed the assessment and treatment plan with the patient. The patient was provided an opportunity to ask questions and all were answered. The patient agreed with the plan and demonstrated an understanding of the instructions.   The patient was advised to call back or seek an in-person evaluation if the symptoms worsen or if the condition fails to improve as anticipated.  I provided 21 minutes of non-face-to-face time during this encounter.   Georgian Co, PA-C

## 2019-11-26 ENCOUNTER — Other Ambulatory Visit: Payer: Self-pay

## 2019-11-26 ENCOUNTER — Ambulatory Visit: Payer: Medicaid Other | Attending: Physician Assistant | Admitting: Physician Assistant

## 2019-11-26 ENCOUNTER — Encounter: Payer: Self-pay | Admitting: Physician Assistant

## 2019-11-26 DIAGNOSIS — Z09 Encounter for follow-up examination after completed treatment for conditions other than malignant neoplasm: Secondary | ICD-10-CM

## 2019-11-26 DIAGNOSIS — R7989 Other specified abnormal findings of blood chemistry: Secondary | ICD-10-CM

## 2019-11-26 DIAGNOSIS — I319 Disease of pericardium, unspecified: Secondary | ICD-10-CM

## 2019-11-26 MED ORDER — IBUPROFEN 800 MG PO TABS: 800.0000 mg | ORAL_TABLET | Freq: Three times a day (TID) | ORAL | 0 refills | Status: DC

## 2019-12-05 ENCOUNTER — Telehealth: Payer: Self-pay | Admitting: Nurse Practitioner

## 2019-12-05 NOTE — Progress Notes (Signed)
CARDIOLOGY OFFICE NOTE  Date:  12/08/2019    Richard Richmond Date of Birth: May 13, 1995 Medical Record #[id number]  PCP:  Patient, No Pcp Per  Cardiologist:  Mayford Knife  Chief Complaint  Patient presents with  . Chest Pain    Work in visit - seen for Dr. Mayford Knife    History of Present Illness: Richard Richmond is a 24 y.o. male who presents today for a work in visit. Seen for Dr. Mayford Knife.   He has had myopericarditis following COVID vaccine back in July. He was in the ER in early September - COVID negative - had diffuse ST elevation - consistent with pericarditis. CTA negative for PE. Cardiac MRI showed findings consistent with myopericarditis with trivial to small pericardial effusion.Area corresponded to RWMA  On echo with mild distal septal and apical hypokinesis.Overall preserved EF 56% with mild distal septal and apicalhypokinesis.  Echo with normal LVEF. Plan was for ibuprofen for 14 days and 3 months of colchicine 0.6mg  qd due to hepatic dysfunction. Per Dr. Jens Som he was to refrain from sports for at least 3 months until cleared by cardiologist but otherwise Dr. Jens Som was OK with returning to work. Noted improvement in LFTs on follow up.   Saw Vin just about a month ago - some chest pain with laying down and bending - working less than 20 hours.   Phone call last week - "Patient is having some CP now with stinging abdominal pain. An appt has been scheduled for 12/08/19 in regards to this."  Thus added to my schedule for today.    Comes in today. Here alone. He notes his chest pain is not totally resolved - still with "shocking" chest pain. Can occur at work, at rest, doing nothing. Random. Sounds like he may have had some of this prior to this recent diagnosis - not to this extent. Remains on Colchicine just once a day (due to prior elevated LFTs). His LFTs have improved. He does have some diarrhea - this is not new. Working about 30 hours per a week. 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 He thought "it would all be gone by now".   Past Medical History:  Diagnosis Date  . Abnormal liver function test    in context of myopericarditis  . Myopericarditis     History reviewed. No pertinent surgical history.   Medications: Current Meds  Medication Sig  . colchicine 0.6 MG tablet Take 1 tablet (0.6 mg total) by mouth daily. For 3 months total.  . ibuprofen (ADVIL) 800 MG tablet Take 1 tablet (800 mg total) by mouth every 8 (eight) hours. Prn pain with food     Allergies: No Known Allergies  Social History: The patient  reports that he has quit smoking. He has quit using smokeless tobacco. He reports previous alcohol use. He reports that he does not use drugs.   Family History: The patient's family history includes Healthy in his father and mother.   Review of Systems: Please see the history of present illness.   All other systems are reviewed and negative.   Physical Exam: VS:  BP 116/70   Pulse 85   Ht 5\' 6"  (1.676 m)   Wt 133 lb (60.3 kg)   SpO2 98%   BMI 21.47 kg/m  .  BMI Body mass index is 21.47 kg/m.  Wt Readings from Last  3 Encounters:  12/08/19 133 lb (60.3 kg)  11/11/19 129 lb (58.5 kg)  10/29/19 125 lb 3.5 oz (56.8 kg)    General: Pleasant. Alert and in no acute distress. He seems anxious.    Cardiac: Regular rate and rhythm. No murmurs, rubs, or gallops. No edema.  Respiratory:  Lungs are clear to auscultation bilaterally with normal work of breathing.  GI: Soft and nontender.  MS: No deformity or atrophy. Gait and ROM intact.  Skin: Warm and dry. Color is normal.  Neuro:  Strength and sensation are intact and no gross focal deficits noted.  Psych: Alert, appropriate and with normal affect.   LABORATORY DATA:  EKG:  EKG is ordered today.  Personally reviewed by me. This demonstrates NSR - with improving lateral T wave changes - HR is 85. Reviewed with  Dr. Mayford Knife.   Lab Results  Component Value Date   WBC 4.5 10/29/2019   HGB 13.8 10/29/2019   HCT 41.3 10/29/2019   PLT 170 10/29/2019   GLUCOSE 72 11/03/2019   ALT 57 (H) 11/11/2019   AST 29 11/11/2019   NA 142 11/03/2019   K 4.6 11/03/2019   CL 99 11/03/2019   CREATININE 0.78 11/03/2019   BUN 13 11/03/2019   CO2 26 11/03/2019   TSH 1.493 10/28/2019   HGBA1C 5.4 10/29/2019     BNP (last 3 results) Recent Labs    10/27/19 1546  BNP 18.2    ProBNP (last 3 results) No results for input(s): PROBNP in the last 8760 hours.   Other Studies Reviewed Today:  cMRI IMPRESSION 10/2019: 1. Findings consistent with myopericarditis with trivial to small pericardial effusion. Marked hyper-enhancement of the apical and RV free wall pericardium with small amount of myocardial uptake in the distal septum and apex consistent with myopericarditis Area corresponds to RWMA with mild distal septal and apical hypokinesis  2. Overall preserved EF 56% with mild distal septal and apical hypokinesis  3.  Normal coronary artery origins no anomaly  4.  Normal RV size and function  5.  Normal cardiac valves  6.  Normal aortic root  7.  No PFO/ASD/VSD  Charlton Haws   Echocardiogram: 10/28/19 1. Left ventricular ejection fraction, by estimation, is 55 to 60%. The  left ventricle has normal function.  2. Right ventricular systolic function is normal. The right ventricular  size is normal. Tricuspid regurgitation signal is inadequate for assessing  PA pressure.  3. The mitral valve is normal in structure. Trivial mitral valve  regurgitation. No evidence of mitral stenosis.  4. The aortic valve is tricuspid. Aortic valve regurgitation is not  visualized. Mild aortic valve sclerosis is present, with no evidence of  aortic valve stenosis.  5. The inferior vena cava is normal in size with greater than 50%  respiratory variability, suggesting right atrial pressure of 3 mmHg.        ASSESSMENT & PLAN:   1. Myopericarditis - still with some symptoms - discussed with Dr. Mayford Knife - will leave on the QD dosing of his Colchicine due to his weight - she would like for him to have repeat markers drawn - will see what that shows - if ok - reassuring - if trending up - would repeat the MRI.   2. Transaminitis - improving. Rechecking today.   Current medicines are reviewed with the patient today.  The patient does not have concerns regarding medicines other than what has been noted above.  The following changes have been made:  See above.  Labs/ tests ordered today include:    Orders Placed This Encounter  Procedures  . Comprehensive metabolic panel  . Sedimentation rate  . C-reactive protein  . EKG 12-Lead     Disposition:   FU with Dr. Mayford Knife as planned in December.   Patient is agreeable to this plan and will call if any problems develop in the interim.   SignedNorma Fredrickson, NP  12/08/2019 11:44 AM  [facility] [address] [place], [place]  [address] Phone: [phone] Fax: [phone]

## 2019-12-05 NOTE — Telephone Encounter (Signed)
Pt reports continued chest discomfort/pain, not worsening. Does state that the ibuprofen isn't "lasting as long as before", so he is feeling discomfort much sooner than before.  He is currently taking 800 mg TID and Colchicine 0.6 mg once daily. Reports that he occasionally lifts boxes at work, weighing 15-20 pds, works 6-7 hour days full time in [employer]. Denies radiating CP, SOB, weight gain/edema, lightheadedness. Reviewed w/ DOD, no orders received. Instructed pt to continue monitoring for now and we would further discuss/address at [facility] f/u appt w/ Norma Fredrickson, NP. Aware that his liver function has improved and could discuss if safe to increase to BID to see if that makes an improvement.  He will ask at appt next week. Pt is agreeable to plan.

## 2019-12-05 NOTE — Telephone Encounter (Signed)
Pt c/o of Chest Pain: STAT if CP now or developed within 24 hours  1. Are you having CP right now? slightly   2. Are you experiencing any other symptoms (ex. SOB, nausea, vomiting, sweating)? Occasional stinging abdominal pain, is occurring rn.   3. How long have you been experiencing CP? Past 4-5 months ago   4. Is your CP continuous or coming and going? Continuous   5. Have you taken Nitroglycerin? No  ? Patient is having some CP now with stinging abdominal pain. An appt has been scheduled for 12/08/19 in regards to this.

## 2019-12-08 ENCOUNTER — Ambulatory Visit (INDEPENDENT_AMBULATORY_CARE_PROVIDER_SITE_OTHER): Payer: Self-pay | Admitting: Nurse Practitioner

## 2019-12-08 ENCOUNTER — Encounter: Payer: Self-pay | Admitting: Nurse Practitioner

## 2019-12-08 ENCOUNTER — Other Ambulatory Visit: Payer: Self-pay

## 2019-12-08 VITALS — BP 116/70 | HR 85 | Ht 66.0 in | Wt 133.0 lb

## 2019-12-08 DIAGNOSIS — R7989 Other specified abnormal findings of blood chemistry: Secondary | ICD-10-CM

## 2019-12-08 DIAGNOSIS — I319 Disease of pericardium, unspecified: Secondary | ICD-10-CM

## 2019-12-08 NOTE — Patient Instructions (Addendum)
After Visit Summary:  We will be checking the following labs today - Sed rate, CRP, and CMET   Medication Instructions:    Continue with your current medicines.    If you need a refill on your cardiac medications before your next appointment, please call your pharmacy.     Testing/Procedures To Be Arranged:  N/A  Follow-Up:   See Dr. Mayford Knife as planned. If your labs come back and are ok - this is reassuring. If they are elevated - we will plan to repeat your MRI    At [facility], you and your health needs are our priority.  As part of our continuing mission to provide you with exceptional heart care, we have created designated Provider Care Teams.  These Care Teams include your primary Cardiologist (physician) and Advanced Practice Providers (APPs -  Physician Assistants and Nurse Practitioners) who all work together to provide you with the care you need, when you need it.  Special Instructions:  . Stay safe, wash your hands for at least 20 seconds and wear a mask when needed.  . It was good to talk with you today.    Call the [facility] office at [phone] if you have any questions, problems or concerns.

## 2019-12-09 LAB — C-REACTIVE PROTEIN: CRP: <1 mg/L (ref 0–10)

## 2019-12-09 LAB — COMPREHENSIVE METABOLIC PANEL
ALT: 16 IU/L (ref 0–44)
AST: 19 IU/L (ref 0–40)
Albumin/Globulin Ratio: 1.9 (ref 1.2–2.2)
Albumin: 5 g/dL (ref 4.1–5.2)
Alkaline Phosphatase: 90 IU/L (ref 44–121)
BUN/Creatinine Ratio: 17 (ref 9–20)
BUN: 14 mg/dL (ref 6–20)
Bilirubin Total: 0.3 mg/dL (ref 0.0–1.2)
CO2: 27 mmol/L (ref 20–29)
Calcium: 9.7 mg/dL (ref 8.7–10.2)
Chloride: 102 mmol/L (ref 96–106)
Creatinine, Ser: 0.84 mg/dL (ref 0.76–1.27)
GFR calc Af Amer: 141 mL/min/{1.73_m2} (ref >59)
GFR calc non Af Amer: 122 mL/min/{1.73_m2} (ref >59)
Globulin, Total: 2.6 g/dL (ref 1.5–4.5)
Glucose: 84 mg/dL (ref 65–99)
Potassium: 4.5 mmol/L (ref 3.5–5.2)
Sodium: 141 mmol/L (ref 134–144)
Total Protein: 7.6 g/dL (ref 6.0–8.5)

## 2019-12-09 LAB — SEDIMENTATION RATE: Sed Rate: 9 mm/hr (ref 0–15)

## 2020-01-27 ENCOUNTER — Encounter: Payer: Self-pay | Admitting: Cardiology

## 2020-01-27 ENCOUNTER — Other Ambulatory Visit: Payer: Self-pay

## 2020-01-27 ENCOUNTER — Ambulatory Visit (INDEPENDENT_AMBULATORY_CARE_PROVIDER_SITE_OTHER): Payer: Self-pay | Admitting: Cardiology

## 2020-01-27 VITALS — BP 108/60 | HR 85 | Ht 66.0 in | Wt 131.0 lb

## 2020-01-27 DIAGNOSIS — I319 Disease of pericardium, unspecified: Secondary | ICD-10-CM

## 2020-01-27 DIAGNOSIS — R7989 Other specified abnormal findings of blood chemistry: Secondary | ICD-10-CM

## 2020-01-27 NOTE — Progress Notes (Signed)
Cardiology Office Note    Date:  01/27/2020   ID:  Richard Richmond, DOB 12/18/1995, MRN [id number]  PCP:  Patient, No Pcp Per  Cardiologist:  Dr. Mayford Knife Chief Complaint: Myopericarditis  History of Present Illness:   Richard Richmond is a 24 y.o. male with recent dx of myopericarditis. He had 1 Covid vaccine back in July. Due to recurrent chest pain, fever and headache came to ER 10/2019. COVID negative. EKG showed diffuse ST elevation (new from 9/4) felt consistent with pericarditis. CTA was negative for PE. cMRI showed "findings consistent with myopericarditis with trivial to small pericardial effusion. Marked hyper-enhancement of the apical and RV free wall pericardium with small amount of myocardial uptake in the distal septum and apex consistent with myopericarditis Area corresponds to RWMA with mild distal septal and apical hypokinesis. Overall preserved EF 56% with mild distal septal and apical hypokinesis." Echo with normal LVEF. Planned for ibuprofen for 14 days and 3 months of colchicine 0.6mg  qd due to hepatic dysfunction.   He was seen back in the office by PA and was still having chest discomfort intermittently.  Noticed chest pain especially with while laying down and bending. He was prescribed another 2 weeks of NSAIDs and continued on colchicine. Seen back by Norma Fredrickson, NP in Oct 2021 and was still having sharp CP that was nonexertional.  He was taking colchicine once daily due to elevated LFTs in the past which had normalized on recheck.  Repeat labs showed a CRP of < 1 and sed rate normal at 9.   He is here today for followup and is doing well.  He is still having pain in the midsternal area and sometimes up near left shoulder.  Usually occurs when going to bed at night and he describes it as a sharp pain or a pressure.  He also feels tense from his neck and down his left arm at times.  He says that the Ibuprofen helped the pain some but not a lot but wore off quickly.  He  denies any  SOB, DOE, PND, orthopnea, LE edema, dizziness, palpitations or syncope. he is compliant with his meds and is tolerating meds with no SE.    Past Medical History:  Diagnosis Date  . Abnormal liver function test    in context of myopericarditis  . Myopericarditis     History reviewed. No pertinent surgical history.  Current Medications: Prior to Admission medications   Medication Sig Start Date End Date Taking? Authorizing Provider  colchicine 0.6 MG tablet Take 1 tablet (0.6 mg total) by mouth daily. For 3 months total. 10/30/19   Lewayne Bunting, MD  ibuprofen (ADVIL) 800 MG tablet Take 1 tablet (800 mg total) by mouth every 8 (eight) hours. 10/29/19   Lewayne Bunting, MD  pantoprazole (PROTONIX) 40 MG tablet Take 1 tablet (40 mg total) by mouth daily. While taking ibuprofen. 10/29/19   Lewayne Bunting, MD    Allergies:   Patient has no known allergies.   Social History   Socioeconomic History  . Marital status: Single    Spouse name: Not on file  . Number of children: Not on file  . Years of education: Not on file  . Highest education level: Not on file  Occupational History  . Not on file  Tobacco Use  . Smoking status: Former Games developer  . Smokeless tobacco: Former Clinical biochemist  . Vaping Use: Never used  Substance and Sexual Activity  .  Alcohol use: Not Currently  . Drug use: Never  . Sexual activity: Yes    Birth control/protection: Condom  Other Topics Concern  . Not on file  Social History Narrative  . Not on file   Social Determinants of Health   Financial Resource Strain:   . Difficulty of Paying Living Expenses: Not on file  Food Insecurity:   . Worried About Programme researcher, broadcasting/film/video in the Last Year: Not on file  . Ran Out of Food in the Last Year: Not on file  Transportation Needs:   . Lack of Transportation (Medical): Not on file  . Lack of Transportation (Non-Medical): Not on file  Physical Activity:   . Days of Exercise per Week: Not on  file  . Minutes of Exercise per Session: Not on file  Stress:   . Feeling of Stress : Not on file  Social Connections:   . Frequency of Communication with Friends and Family: Not on file  . Frequency of Social Gatherings with Friends and Family: Not on file  . Attends Religious Services: Not on file  . Active Member of Clubs or Organizations: Not on file  . Attends Banker Meetings: Not on file  . Marital Status: Not on file     Family History:  The patient's family history includes Healthy in his father and mother.   ROS:   Please see the history of present illness.    ROS All other systems reviewed and are negative.   PHYSICAL EXAM:   VS:  BP 108/60   Pulse 85   Ht 5\' 6"  (1.676 m)   Wt 131 lb (59.4 kg)   SpO2 98%   BMI 21.14 kg/m    GEN: Well nourished, well developed in no acute distress HEENT: Normal NECK: No JVD; No carotid bruits LYMPHATICS: No lymphadenopathy CARDIAC:RRR, no murmurs, rubs, gallops RESPIRATORY:  Clear to auscultation without rales, wheezing or rhonchi  ABDOMEN: Soft, non-tender, non-distended MUSCULOSKELETAL:  No edema; No deformity  SKIN: Warm and dry NEUROLOGIC:  Alert and oriented x 3 PSYCHIATRIC:  Normal affect    Wt Readings from Last 3 Encounters:  01/27/20 131 lb (59.4 kg)  12/08/19 133 lb (60.3 kg)  11/11/19 129 lb (58.5 kg)      Studies/Labs Reviewed:   EKG:  EKG is not ordered today.    Recent Labs: 10/27/2019: B Natriuretic Peptide 18.2 10/28/2019: TSH 1.493 10/29/2019: Hemoglobin 13.8; Platelets 170 12/08/2019: ALT 16; BUN 14; Creatinine, Ser 0.84; Potassium 4.5; Sodium 141   Lipid Panel No results found for: CHOL, TRIG, HDL, CHOLHDL, VLDL, LDLCALC, LDLDIRECT  Additional studies/ records that were reviewed today include:   Echocardiogram: 10/28/19 1. Left ventricular ejection fraction, by estimation, is 55 to 60%. The  left ventricle has normal function.  2. Right ventricular systolic function is normal. The  right ventricular  size is normal. Tricuspid regurgitation signal is inadequate for assessing  PA pressure.  3. The mitral valve is normal in structure. Trivial mitral valve  regurgitation. No evidence of mitral stenosis.  4. The aortic valve is tricuspid. Aortic valve regurgitation is not  visualized. Mild aortic valve sclerosis is present, with no evidence of  aortic valve stenosis.  5. The inferior vena cava is normal in size with greater than 50%  respiratory variability, suggesting right atrial pressure of 3 mmHg.    ASSESSMENT & PLAN:    1.  Myopericarditis -was still having pleuritic sx in Oct and NSAIDS continued for 2 more  weeks and continued on once daily colchicine -still having atypical CP in the midsternal area and upper left chest that is sharp and worse at night -inflammatory markers were normal a few months back -I do not think that this is ongoing inflammation but will get a cardiac MRI to check>>I will also have who reads the study verify that the coronary ostia are originating normally -if MRI is normal then will not refill colchicine and PRN followup with me and followup with PCP -I have instructed him not to exercise for another 3 months  2.  Transaminitis -LFTs have normalized on last check 11/2019  Medication Adjustments/Labs and Tests Ordered: Current medicines are reviewed at length with the patient today.  Concerns regarding medicines are outlined above.  Medication changes, Labs and Tests ordered today are listed in the Patient Instructions below. There are no Patient Instructions on file for this visit.   Signed, Armanda Magic, MD  01/27/2020 3:15 PM    [facility] [address] [place], [place], [place]  [address] Phone: [phone]; Fax: [phone]

## 2020-01-27 NOTE — Patient Instructions (Signed)
Medication Instructions:  Your physician recommends that you continue on your current medications as directed. Please refer to the Current Medication list given to you today.  *If you need a refill on your cardiac medications before your next appointment, please call your pharmacy*   Testing/Procedures: Your physician has requested that you have a cardiac MRI. Cardiac MRI uses a computer to create images of your heart as its beating, producing both still and moving pictures of your heart and major blood vessels. For further information please visit [email or web address]. Please follow the instruction sheet given to you today for more information.   Follow-Up: At [facility], you and your health needs are our priority.  As part of our continuing mission to provide you with exceptional heart care, we have created designated Provider Care Teams.  These Care Teams include your primary Cardiologist (physician) and Advanced Practice Providers (APPs -  Physician Assistants and Nurse Practitioners) who all work together to provide you with the care you need, when you need it.  We recommend signing up for the patient portal called "MyChart".  Sign up information is provided on this After Visit Summary.  MyChart is used to connect with patients for Virtual Visits (Telemedicine).  Patients are able to view lab/test results, encounter notes, upcoming appointments, etc.  Non-urgent messages can be sent to your provider as well.   To learn more about what you can do with MyChart, go to [email or web address].    Follow up with Dr. Mayford Knife as needed based on results of testing.

## 2020-01-27 NOTE — Addendum Note (Signed)
Addended by: Theresia Majors on: 01/27/2020 03:23 PM   Modules accepted: Orders

## 2020-01-29 ENCOUNTER — Telehealth: Payer: Self-pay | Admitting: Cardiology

## 2020-01-29 NOTE — Telephone Encounter (Signed)
Left message for patient to call and discuss scheduling the Cardiac MRI ordered by Dr. Turner 

## 2020-01-29 NOTE — Telephone Encounter (Signed)
Spoke with patient regarding preferred weekdays and times for scheduling the Cardiac MRI ordered by Dr. Clovia Cuff patient as soon as we hear from the insurance company regarding the prior authorization-I will call with the appointment information

## 2020-02-03 ENCOUNTER — Encounter: Payer: Self-pay | Admitting: Cardiology

## 2020-02-03 NOTE — Telephone Encounter (Signed)
Spoke with patient regarding Cardiac MRI appointment schedule Friday 03/05/20 at 9:00 am at [facility]----arrival tim sis 8:30 am---1st floor admissions office.  Will mail inforrmation to patient and he voiced his understanding.

## 2020-03-04 ENCOUNTER — Telehealth (HOSPITAL_COMMUNITY): Payer: Self-pay | Admitting: Emergency Medicine

## 2020-03-04 NOTE — Telephone Encounter (Signed)
Reaching out to patient to offer assistance regarding upcoming cardiac imaging study; pt verbalizes understanding of appt date/time, parking situation and where to check in, pre-test NPO status and medications ordered, and verified current allergies; name and call back number provided for further questions should they arise Flor Houdeshell RN Navigator Cardiac Imaging [facility] [phone] office [phone] cell  Denies claustro, denies implants 

## 2020-03-05 ENCOUNTER — Other Ambulatory Visit: Payer: Self-pay

## 2020-03-05 ENCOUNTER — Ambulatory Visit (HOSPITAL_COMMUNITY)
Admission: RE | Admit: 2020-03-05 | Discharge: 2020-03-05 | Disposition: A | Discharge status: Home / Self Care | Payer: Medicaid Other | Source: Ambulatory Visit | Attending: Cardiology | Admitting: Cardiology

## 2020-03-05 DIAGNOSIS — I319 Disease of pericardium, unspecified: Secondary | ICD-10-CM

## 2020-03-05 IMAGING — MR MR CARD MORPHOLOGY WO/W CM
45 of 48 series · 45 of 48 positions shown · IV contrast (gadavist)
Comparison: none

CLINICAL DATA: Clinical question of myopericarditis
24 year-old Male, study assumes HCT of

EXAM:
CARDIAC MRI
TECHNIQUE: The patient was scanned on a 1.5 Tesla GE magnet. A dedicated
cardiac coil was used. Functional imaging was done using Fiesta
sequences. [DATE], and 4 chamber views were done to assess for RWMA's.
Modified DRISSIYA rule using a short axis stack was used to
calculate an ejection fraction on a dedicated work station using
Circle software. The patient received 7 cc of Gadavist. After 10
minutes inversion recovery sequences were used to assess for
infiltration and scar tissue.
CONTRAST:  7 cc  of Gadavist

[Series 4: [id number]_bh · axial · 8.0mm · 1.41mm/px · 1 of 18 slices shown]
[im 1/18]
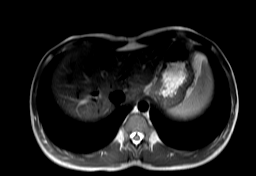

[Series 8: bSSFP · oblique · 8.0mm · 1.61mm/px · 1 of 25 slices shown (1 of 24)]
[im 1/25]
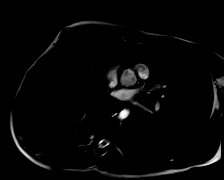

[Series 9: bSSFP · oblique · 8.0mm · 1.61mm/px · 1 of 25 slices shown (2 of 24)]
[im 1/25]
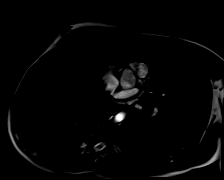

[Series 10: bSSFP · oblique · 8.0mm · 1.52mm/px · 1 of 25 slices shown (3 of 24)]
[im 1/25]
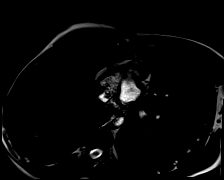

[Series 11: bSSFP · oblique · 8.0mm · 1.52mm/px · 1 of 25 slices shown (4 of 24)]
[im 1/25]
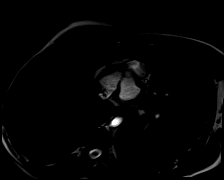

[Series 12: bSSFP · oblique · 8.0mm · 1.52mm/px · 1 of 25 slices shown (5 of 24)]
[im 1/25]
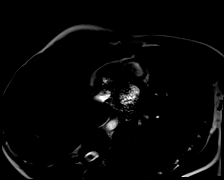

[Series 14: bSSFP · oblique · 8.0mm · 1.61mm/px · 1 of 25 slices shown (6 of 24)]
[im 1/25]
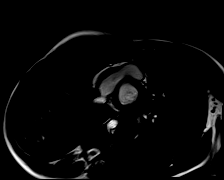

[Series 15: bSSFP · oblique · 8.0mm · 1.43mm/px · 1 of 25 slices shown (7 of 24)]
[im 1/25]
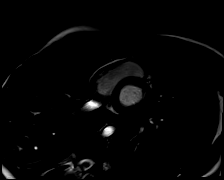

[Series 16: bSSFP · oblique · 8.0mm · 1.43mm/px · 1 of 25 slices shown (8 of 24)]
[im 1/25]
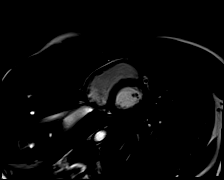

[Series 17: bSSFP · oblique · 8.0mm · 1.43mm/px · 1 of 25 slices shown (9 of 24)]
[im 1/25]
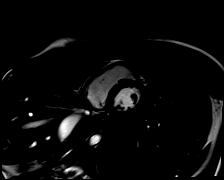

[Series 18: bSSFP · oblique · 8.0mm · 1.43mm/px · 1 of 25 slices shown (10 of 24)]
[im 1/25]
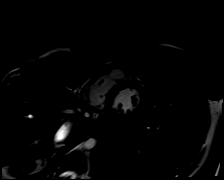

[Series 19: bSSFP · oblique · 8.0mm · 1.43mm/px · 1 of 25 slices shown (11 of 24)]
[im 1/25]
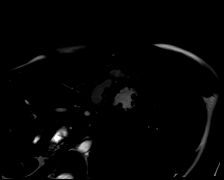

[Series 20: bSSFP · oblique · 8.0mm · 1.43mm/px · 1 of 25 slices shown (12 of 24)]
[im 1/25]
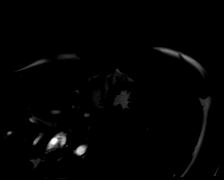

[Series 21: bSSFP · oblique · 8.0mm · 1.43mm/px · 1 of 25 slices shown (13 of 24)]
[im 1/25]
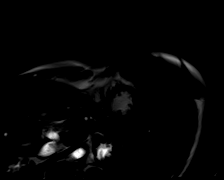

[Series 22: bSSFP · oblique · 8.0mm · 1.43mm/px · 1 of 25 slices shown (14 of 24)]
[im 1/25]
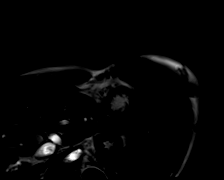

[Series 23: bSSFP · oblique · 8.0mm · 1.43mm/px · 1 of 25 slices shown (15 of 24)]
[im 1/25]
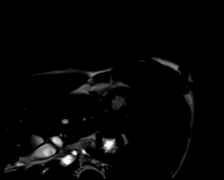

[Series 24: bSSFP · oblique · 8.0mm · 1.43mm/px · 1 of 25 slices shown (16 of 24)]
[im 1/25]
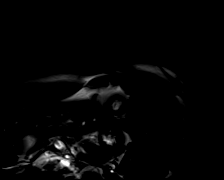

[Series 25: bSSFP · oblique · 8.0mm · 1.43mm/px · 1 of 25 slices shown (17 of 24)]
[im 1/25]
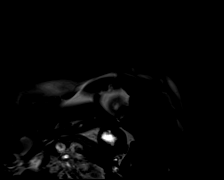

[Series 26: bSSFP · oblique · 8.0mm · 1.43mm/px · 1 of 25 slices shown (18 of 24)]
[im 1/25]
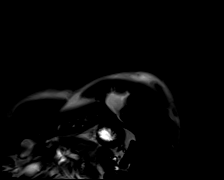

[Series 27: bSSFP · oblique · 8.0mm · 1.43mm/px · 1 of 25 slices shown (19 of 24)]
[im 1/25]
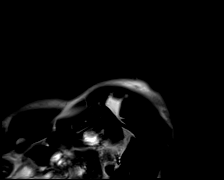

[Series 28: bSSFP · oblique · 8.0mm · 1.43mm/px · 1 of 25 slices shown (20 of 24)]
[im 1/25]
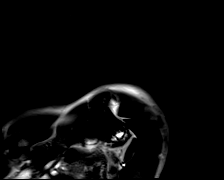

[Series 29: bSSFP · oblique · 6.0mm · 1.41mm/px · 1 of 25 slices shown (21 of 24)]
[im 1/25]
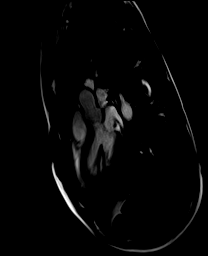

[Series 30: bSSFP · oblique · 6.0mm · 1.41mm/px · 1 of 25 slices shown (22 of 24)]
[im 1/25]
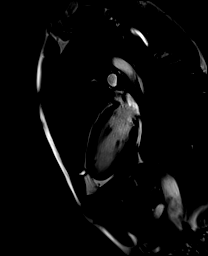

[Series 31: bSSFP · oblique · 6.0mm · 1.33mm/px · 1 of 25 slices shown (23 of 24)]
[im 1/25]
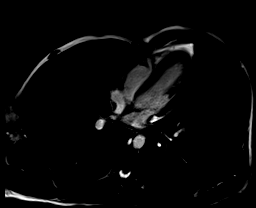

[Series 32: STIR · oblique · 8.0mm · 1.92mm/px · 1 of 17 slices shown]
[im 1/17]
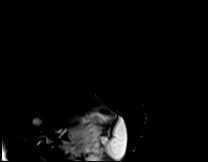

[Series 33: bSSFP · coronal · 6.0mm · 1.41mm/px · 1 of 25 slices shown (24 of 24)]
[im 1/25]
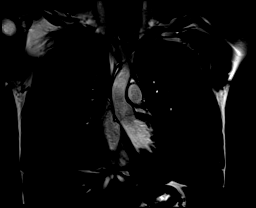

[Series 35: cine aortic valve · axial · 5.0mm · 1.34mm/px · 1 of 25 slices shown (1 of 3)]
[im 1/25]
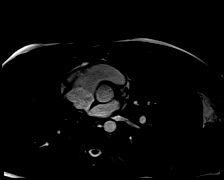

[Series 35: cine aortic valve · axial · 5.0mm · 1.34mm/px · 1 of 25 slices shown (2 of 3)]
[im 1/25]
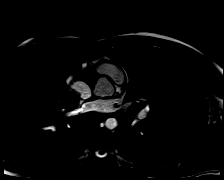

[Series 35: cine aortic valve · axial · 5.0mm · 1.34mm/px · 1 of 25 slices shown (3 of 3)]
[im 1/25]
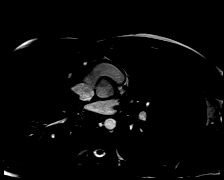

[Series 39: (id)_long_t1 · oblique · 8.0mm · 1.56mm/px · 1 of 24 slices shown]
[im 1/24]
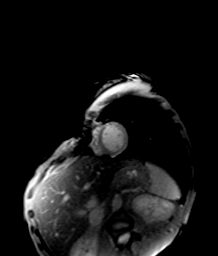

[Series 40: (id)_long_t1_moco · oblique · 8.0mm · 1.56mm/px · 1 of 24 slices shown]
[im 1/24]
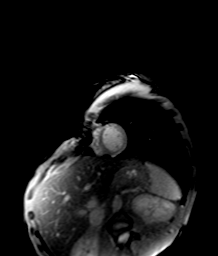

[Series 41: (id)_long_t1_moco_t1 · oblique · 8.0mm · 1.56mm/px · 1 of 6 slices shown]
[im 1/6]
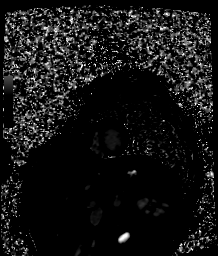

[Series 44: (id)_trufi · oblique · 8.0mm · 2.08mm/px · 1 of 9 slices shown]
[im 1/9]
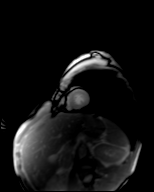

[Series 45: (id)_trufi_moco · oblique · 8.0mm · 2.08mm/px · 1 of 9 slices shown]
[im 1/9]
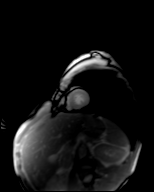

[Series 46: (id)_trufi_moco_t2 · oblique · 8.0mm · 2.08mm/px · 1 of 6 slices shown]
[im 1/6]
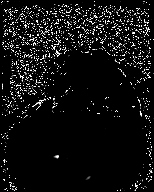

[Series 49: cine rvit · oblique · 6.0mm · 1.41mm/px · 1 of 25 slices shown]
[im 1/25]
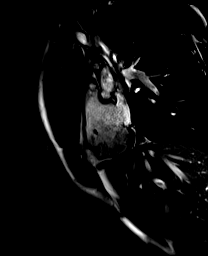

[Series 52: cine rvot · sagittal · 6.0mm · 1.41mm/px · 1 of 25 slices shown]
[im 1/25]
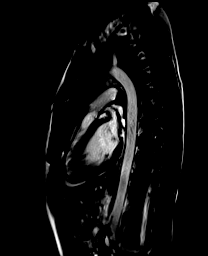

[Series 60: lge_single shot sa · oblique · 8.0mm · 1.88mm/px · 1 of 18 slices shown (1 of 2)]
[im 1/18]
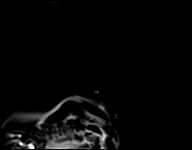

[Series 61: lge_single shot sa · oblique · 8.0mm · 1.88mm/px · 1 of 18 slices shown (2 of 2)]
[im 1/18]
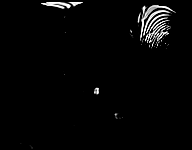

[Series 64: (id)_short_t1 · oblique · 8.0mm · 1.56mm/px · 1 of 27 slices shown]
[im 1/27]
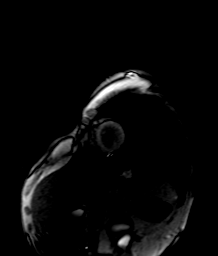

[Series 65: (id)_short_t1_moco · oblique · 8.0mm · 1.56mm/px · 1 of 27 slices shown]
[im 1/27]
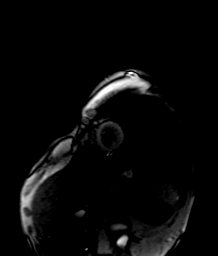

[Series 66: (id)_short_t1_moco_t1 · oblique · 8.0mm · 1.56mm/px · 1 of 6 slices shown]
[im 1/6]
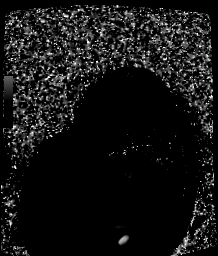

[Series 69: lge short axis · oblique · 8.0mm · 1.67mm/px · 1 of 18 slices shown (1 of 2)]
[im 1/18]
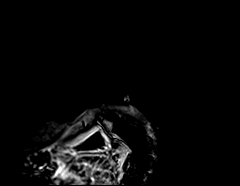

[Series 70: lge short axis · oblique · 8.0mm · 1.67mm/px · 1 of 18 slices shown (2 of 2)]
[im 1/18]
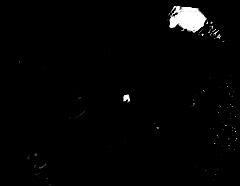

[Series 77: T2 fat-sat · axial · 6.0mm · 1.50mm/px · 1 of 12 slices shown]
[im 1/12]
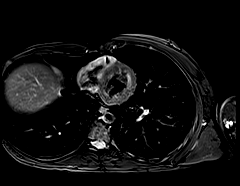

[45 of 48 positions shown; findings below may reference images not displayed]

FINDINGS: 1. Normal left ventricular size,  LVEDVi 52 mL/m2.

Normal left ventricular thickness, with intraventricular septal
thickness of 7 mm, posterior wall thickness of 8 mm, and septal to
posterior ratio < 1.5.

Normal left ventricular systolic function (LVEF =59%). There are no
regional wall motion abnormalities.

Left ventricular parametric mapping notable for no elevation in
Native T1 or ECV signal. No elevation in T2 myocardial signal.

There is no late gadolinium enhancement in the left ventricular
myocardium.

2. Normal right ventricular size with RVEDVI 80 mL/m2.

Normal right ventricular thickness.

Low normal right ventricular systolic function (RVEF =42 %). There
are no regional wall motion abnormalities or aneurysms.

3. Normal left and right atrial size, with LAESV 35 mL and RAESV 18
mL.

4. Normal size of the aortic root, ascending aorta and pulmonary
artery.

5.  No significant valvular abnormalities.

6. Normal pericardium. No pericardial effusion. No LGE pericardial
hyper-enhancement. Qualitatively, no increase in pericardial T2
signal.

7. Grossly, no extracardiac findings. Recommended dedicated study if
concerned for non-cardiac pathology.
IMPRESSION: Normal Left ventricular function with low normal right ventricular
function

Modified DRISSIYA Criteria not met for myocarditis

Compared to prior study:

Normal coronary ostia; better demonstrated in prior study.

Resolution of late gadolinium enhancement seen in prior study

Improvement in LV wall motion

## 2020-03-05 MED ORDER — GADOBUTROL 1 MMOL/ML IV SOLN
7.0000 mL | Freq: Once | INTRAVENOUS | Status: AC | PRN
  Administered 2020-03-05: 7 mL via INTRAVENOUS

## 2020-04-16 ENCOUNTER — Ambulatory Visit
Admission: EM | Admit: 2020-04-16 | Discharge: 2020-04-16 | Disposition: A | Discharge status: Home / Self Care | Payer: Self-pay | Attending: Family Medicine | Admitting: Family Medicine

## 2020-04-16 ENCOUNTER — Encounter: Payer: Self-pay | Admitting: Emergency Medicine

## 2020-04-16 ENCOUNTER — Ambulatory Visit (INDEPENDENT_AMBULATORY_CARE_PROVIDER_SITE_OTHER): Payer: Self-pay

## 2020-04-16 ENCOUNTER — Other Ambulatory Visit: Payer: Self-pay

## 2020-04-16 DIAGNOSIS — Z20822 Contact with and (suspected) exposure to covid-19: Secondary | ICD-10-CM | POA: Insufficient documentation

## 2020-04-16 DIAGNOSIS — R0602 Shortness of breath: Secondary | ICD-10-CM

## 2020-04-16 DIAGNOSIS — R0789 Other chest pain: Secondary | ICD-10-CM

## 2020-04-16 DIAGNOSIS — R059 Cough, unspecified: Secondary | ICD-10-CM

## 2020-04-16 DIAGNOSIS — R509 Fever, unspecified: Secondary | ICD-10-CM

## 2020-04-16 DIAGNOSIS — R112 Nausea with vomiting, unspecified: Secondary | ICD-10-CM

## 2020-04-16 DIAGNOSIS — Z1152 Encounter for screening for COVID-19: Secondary | ICD-10-CM

## 2020-04-16 IMAGING — DX DG CHEST 2V
2 series · 2 of 2 positions shown · non-contrast
Comparison: [DATE]

CLINICAL DATA: Chest tightness, fever, cough for 3 days.

EXAM:
CHEST - 2 VIEW

[chest pa]
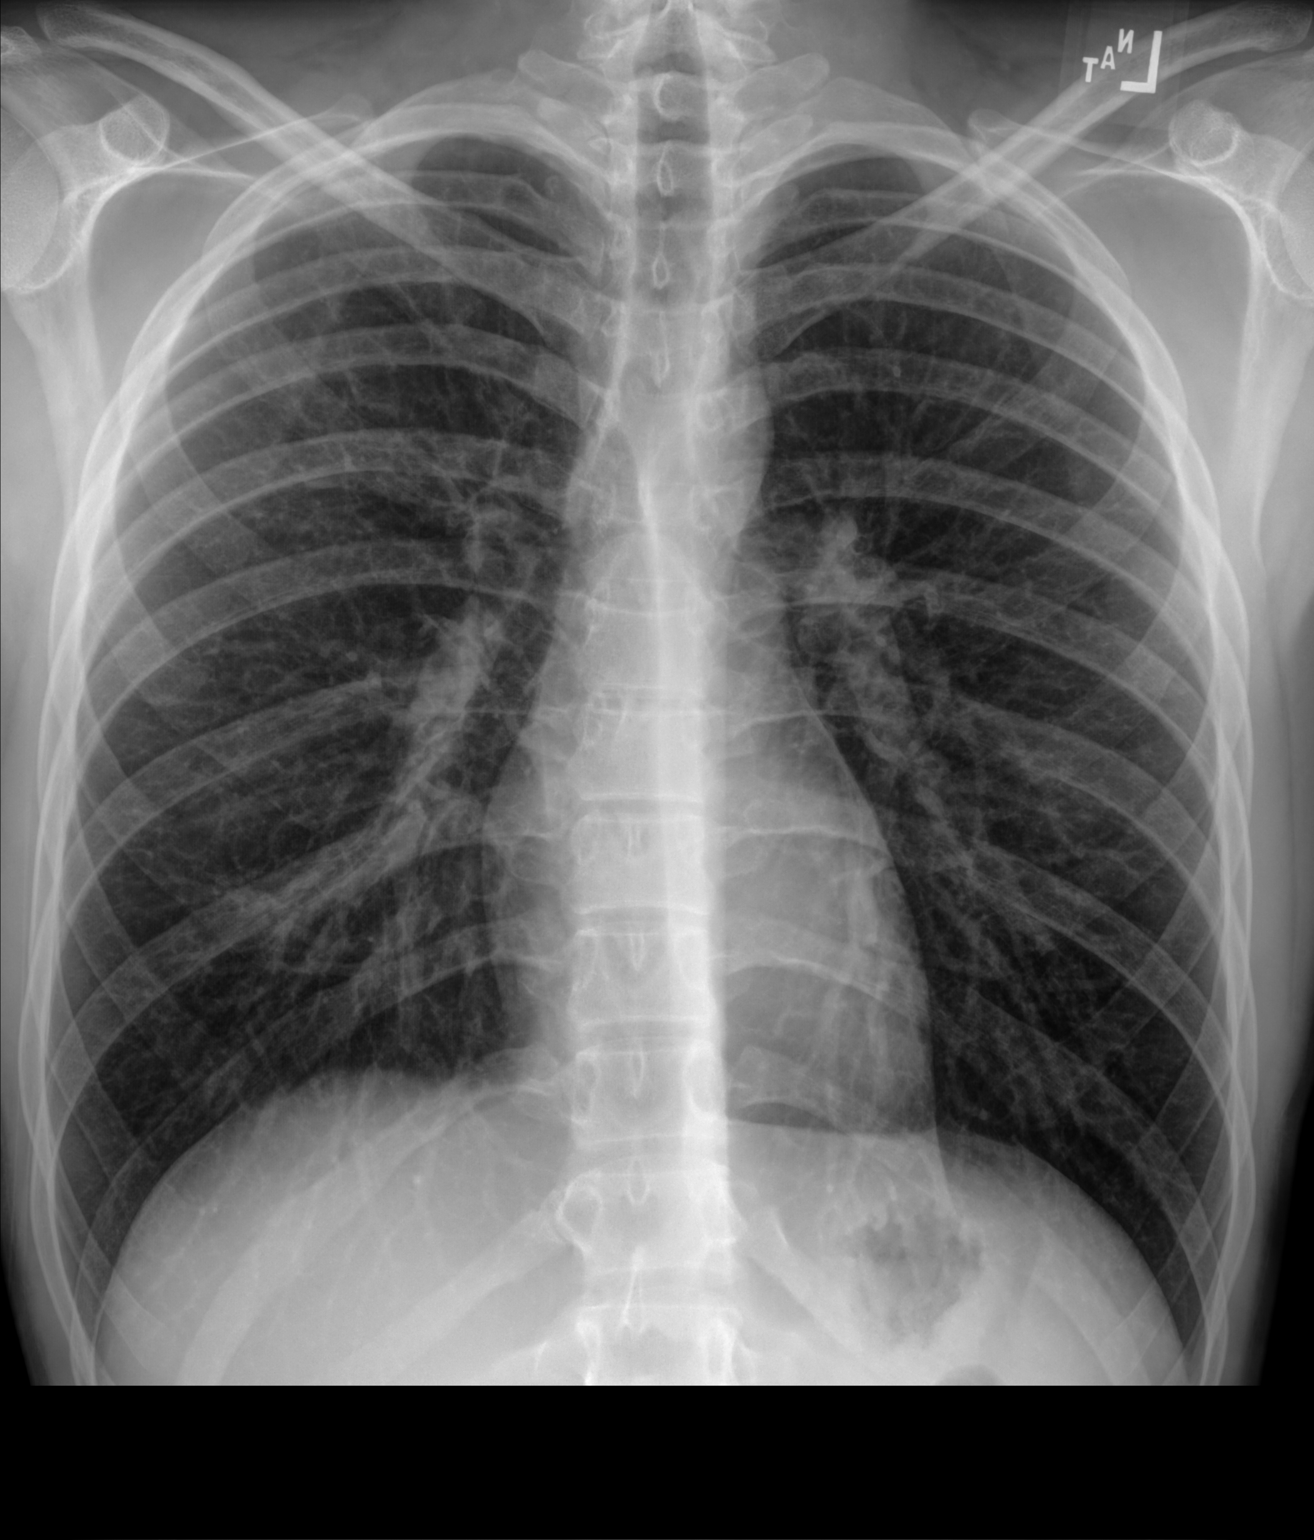

[chest lat]
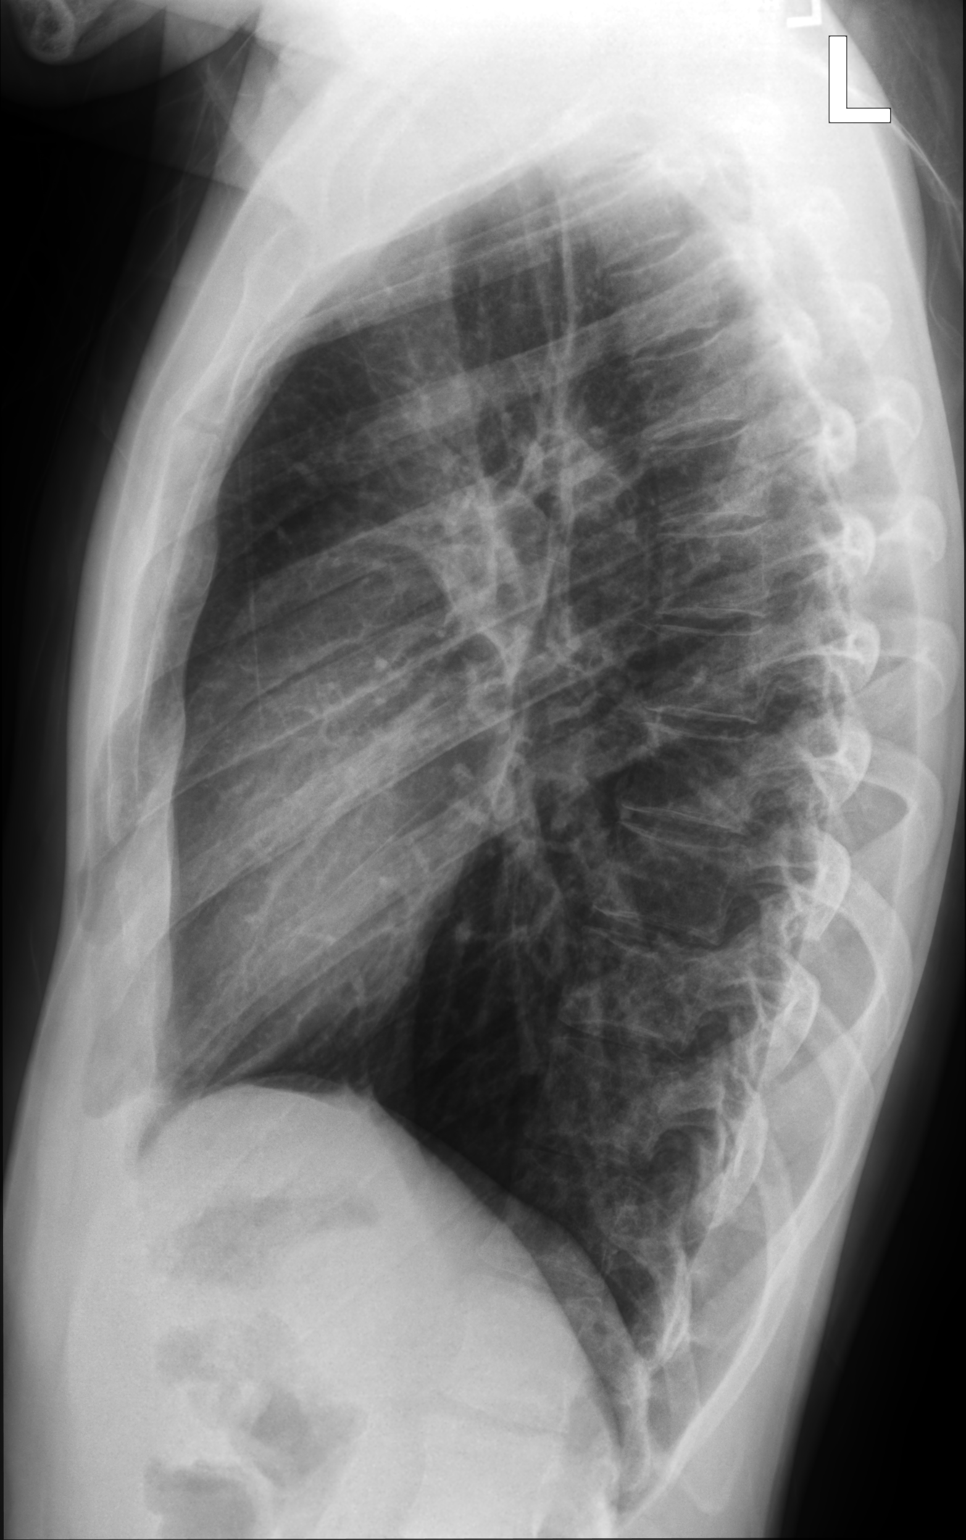

[2 of 2 positions shown; findings below may reference images not displayed]

FINDINGS: The heart size and mediastinal contours are within normal limits.
Both lungs are clear. The visualized skeletal structures are
unremarkable.
IMPRESSION: No active cardiopulmonary disease.

## 2020-04-16 MED ORDER — ALBUTEROL SULFATE HFA 108 (90 BASE) MCG/ACT IN AERS: 1.0000 | INHALATION_SPRAY | Freq: Four times a day (QID) | RESPIRATORY_TRACT | 0 refills | Status: DC | PRN

## 2020-04-16 MED ORDER — ONDANSETRON 4 MG PO TBDP: 4.0000 mg | ORAL_TABLET | Freq: Three times a day (TID) | ORAL | 0 refills | Status: DC | PRN

## 2020-04-16 NOTE — ED Triage Notes (Signed)
Pt here for cough and nausea after having fever 2 days ago that now resolved; pt sts pain with cough and inspiration at times

## 2020-04-16 NOTE — ED Provider Notes (Signed)
[facility]    CSN: [id number] Arrival date & time: 04/16/20  1510      History   Chief Complaint Chief Complaint  Patient presents with  . Cough  . Nausea    HPI Richard Richmond is a 25 y.o. male.   Here today with 2 day history of fever, chest tightness, cough, nausea and vomiting. Denies CP, SOB, abdominal pain, diarrhea, sore throat, congestion. No known sick contacts, recent travel, dizziness, syncope. Has been followed by Cardiology the past few months for myopericarditis which has resolved. Taking OTC pain relievers without benefit.  Has recently had several sick contacts but does not feel that any of them were Covid related.  Prior to no cardiac issues, states no chronic medical problems that he is aware of.    Past Medical History:  Diagnosis Date  . Abnormal liver function test    in context of myopericarditis  . Myopericarditis     Patient Active Problem List   Diagnosis Date Noted  . Myopericarditis 10/29/2019  . Elevated liver function tests 10/29/2019  . Thrombocytopenia - transient, resolved 10/29/2019    History reviewed. No pertinent surgical history.     Home Medications    Prior to Admission medications   Medication Sig Start Date End Date Taking? Authorizing Provider  albuterol (VENTOLIN HFA) 108 (90 Base) MCG/ACT inhaler Inhale 1-2 puffs into the lungs every 6 (six) hours as needed for wheezing or shortness of breath. 04/16/20  Yes Particia Nearing, PA-C  ondansetron (ZOFRAN ODT) 4 MG disintegrating tablet Take 1 tablet (4 mg total) by mouth every 8 (eight) hours as needed for nausea or vomiting. 04/16/20  Yes Particia Nearing, PA-C  colchicine 0.6 MG tablet Take 1 tablet (0.6 mg total) by mouth daily. For 3 months total. Patient not taking: Reported on 04/16/2020 10/30/19   Lewayne Bunting, MD    Family History Family History  Problem Relation Age of Onset  . Healthy Mother   . Healthy Father     Social  History Social History   Tobacco Use  . Smoking status: Former Games developer  . Smokeless tobacco: Former Clinical biochemist  . Vaping Use: Never used  Substance Use Topics  . Alcohol use: Not Currently  . Drug use: Never     Allergies   Patient has no known allergies.   Review of Systems Review of Systems PER HPI    Physical Exam Triage Vital Signs ED Triage Vitals [04/16/20 1524]  Enc Vitals Group     BP 121/63     Pulse Rate 93     Resp 18     Temp 98.2 F (36.8 C)     Temp Source Oral     SpO2 97 %     Weight      Height      Head Circumference      Peak Flow      Pain Score 5     Pain Loc      Pain Edu?      Excl. in GC?    No data found.  Updated Vital Signs BP 121/63 (BP Location: Right Arm)   Pulse 93   Temp 98.2 F (36.8 C) (Oral)   Resp 18   SpO2 97%   Visual Acuity Right Eye Distance:   Left Eye Distance:   Bilateral Distance:    Right Eye Near:   Left Eye Near:    Bilateral Near:  Physical Exam Vitals and nursing note reviewed.  Constitutional:      Appearance: Normal appearance.  HENT:     Head: Atraumatic.     Right Ear: Tympanic membrane normal.     Left Ear: Tympanic membrane normal.     Nose: Nose normal.     Mouth/Throat:     Mouth: Mucous membranes are moist.     Pharynx: Oropharynx is clear. No posterior oropharyngeal erythema.  Eyes:     Extraocular Movements: Extraocular movements intact.     Conjunctiva/sclera: Conjunctivae normal.  Cardiovascular:     Rate and Rhythm: Normal rate and regular rhythm.     Heart sounds: Normal heart sounds.  Pulmonary:     Effort: Pulmonary effort is normal.     Breath sounds: Normal breath sounds.  Abdominal:     General: Bowel sounds are normal. There is no distension.     Palpations: Abdomen is soft.     Tenderness: There is no abdominal tenderness. There is no right CVA tenderness, left CVA tenderness or guarding.  Musculoskeletal:        General: Normal range of motion.      Cervical back: Normal range of motion and neck supple.  Skin:    General: Skin is warm and dry.  Neurological:     General: No focal deficit present.     Mental Status: He is oriented to person, place, and time.  Psychiatric:        Mood and Affect: Mood normal.        Thought Content: Thought content normal.        Judgment: Judgment normal.     UC Treatments / Results  Labs (all labs ordered are listed, but only abnormal results are displayed) Labs Reviewed  NOVEL CORONAVIRUS, NAA    EKG   Radiology DG Chest 2 View  Result Date: 04/16/2020 CLINICAL DATA:  Chest tightness, fever, cough for 3 days. EXAM: CHEST - 2 VIEW COMPARISON:  10/27/2019 FINDINGS: The heart size and mediastinal contours are within normal limits. Both lungs are clear. The visualized skeletal structures are unremarkable. IMPRESSION: No active cardiopulmonary disease. Electronically Signed   By: Norva Pavlov M.D.   On: 04/16/2020 16:37    Procedures Procedures (including critical care time)  Medications Ordered in UC Medications - No data to display  Initial Impression / Assessment and Plan / UC Course  I have reviewed the triage vital signs and the nursing notes.  Pertinent labs & imaging results that were available during my care of the patient were reviewed by me and considered in my medical decision making (see chart for details).     Vital signs, exam very reassuring today.  EKG, chest x-ray without any acute abnormalities today in normal sinus rhythm.  Covid PCR pending.  Work note given with isolation instructions.  We will treat his nausea with Zofran as needed and will send albuterol inhaler for as needed use for his chest tightness, shortness of breath.  Supportive home care reviewed.  Return for acutely worsening symptoms.  Final Clinical Impressions(s) / UC Diagnoses   Final diagnoses:  Encounter for screening laboratory testing for COVID-19 virus  Chest tightness  Fever, unspecified   Nausea and vomiting, intractability of vomiting not specified, unspecified vomiting type   Discharge Instructions   None    ED Prescriptions    Medication Sig Dispense Auth. Provider   ondansetron (ZOFRAN ODT) 4 MG disintegrating tablet Take 1 tablet (4 mg total) by mouth  every 8 (eight) hours as needed for nausea or vomiting. 20 tablet Particia Nearing, [place]   albuterol (VENTOLIN HFA) 108 (90 Base) MCG/ACT inhaler Inhale 1-2 puffs into the lungs every 6 (six) hours as needed for wheezing or shortness of breath. 18 g Particia Nearing, [place]     PDMP not reviewed this encounter.   Particia Nearing, [place] 04/17/20 1148

## 2020-04-17 LAB — SARS-COV-2, NAA 2 DAY TAT

## 2020-04-17 LAB — NOVEL CORONAVIRUS, NAA: SARS-CoV-2, NAA: NOT DETECTED

## 2020-04-25 ENCOUNTER — Encounter (HOSPITAL_COMMUNITY): Payer: Self-pay

## 2020-04-25 ENCOUNTER — Other Ambulatory Visit: Payer: Self-pay

## 2020-04-25 ENCOUNTER — Emergency Department (HOSPITAL_COMMUNITY): Payer: Self-pay

## 2020-04-25 ENCOUNTER — Emergency Department (HOSPITAL_COMMUNITY)
Admission: EM | Admit: 2020-04-25 | Discharge: 2020-04-25 | Disposition: A | Discharge status: Home / Self Care | Payer: Self-pay | Attending: Emergency Medicine | Admitting: Emergency Medicine

## 2020-04-25 DIAGNOSIS — Z20822 Contact with and (suspected) exposure to covid-19: Secondary | ICD-10-CM | POA: Insufficient documentation

## 2020-04-25 DIAGNOSIS — Z87891 Personal history of nicotine dependence: Secondary | ICD-10-CM | POA: Insufficient documentation

## 2020-04-25 DIAGNOSIS — R509 Fever, unspecified: Secondary | ICD-10-CM | POA: Insufficient documentation

## 2020-04-25 DIAGNOSIS — R059 Cough, unspecified: Secondary | ICD-10-CM | POA: Insufficient documentation

## 2020-04-25 DIAGNOSIS — M791 Myalgia, unspecified site: Secondary | ICD-10-CM | POA: Insufficient documentation

## 2020-04-25 IMAGING — DX DG CHEST 1V PORT
1 series · 1 of 1 positions shown · non-contrast
Comparison: [DATE]

CLINICAL DATA: Cough.  Body aches.

EXAM:
PORTABLE CHEST 1 VIEW

[chest ap]
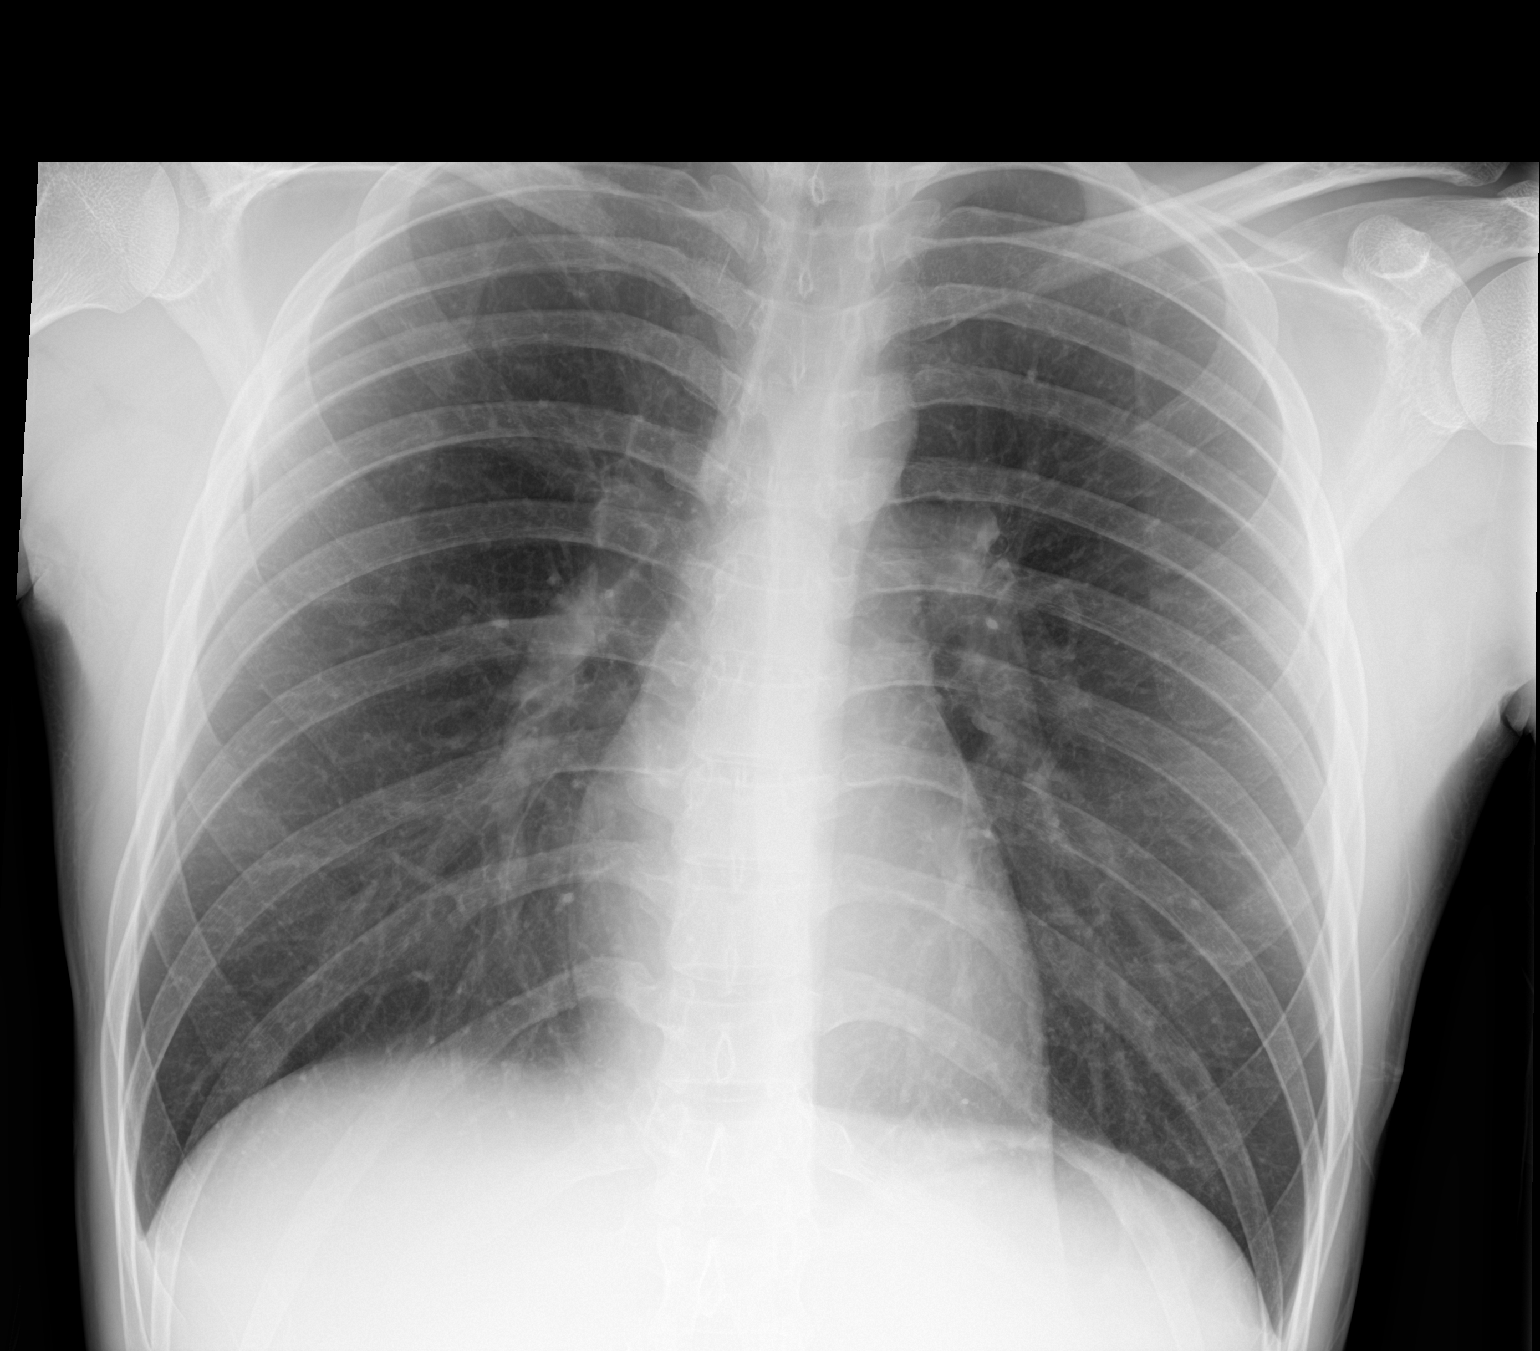

[1 of 1 positions shown; findings below may reference images not displayed]

FINDINGS: Midline trachea. Normal heart size and mediastinal contours. No
pleural effusion or pneumothorax. Clear lungs.
IMPRESSION: Normal chest.

## 2020-04-25 MED ORDER — PANTOPRAZOLE SODIUM 20 MG PO TBEC: 20.0000 mg | DELAYED_RELEASE_TABLET | Freq: Every day | ORAL | 0 refills | Status: DC

## 2020-04-25 NOTE — Discharge Instructions (Signed)
Chest x-ray today shows no evidence of lung infection.  Suspect your cough might be a post viral syndrome or may be related to reflux.  Start Protonix as prescribed for reflux.  Follow-up with the wellness center.  Please return to the ED if symptoms worsen.  You have been tested for Covid as well.  You should have your results within the next 6 to 24 hours.  Please check your MyChart for results.

## 2020-04-25 NOTE — ED Triage Notes (Signed)
Patient states he had a fever 5 days ago and then developed a cough 3 days later. patient states that he had a negative Covid test a week ago. Patient c/o cough and body aches today.  Patient states he was given an Albuterol inhaler and Zofran.

## 2020-04-25 NOTE — ED Provider Notes (Signed)
[facility]   CSN: [id number] Arrival date & time: 04/25/20  1357     History Chief Complaint  Patient presents with  . Cough  . Generalized Body Aches  . Fever    Richard Richmond is a 25 y.o. male.  The history is provided by the patient.  Cough Cough characteristics:  Non-productive Sputum characteristics:  Nondescript Severity:  Mild Onset quality:  Gradual Timing:  Intermittent Progression:  Waxing and waning Chronicity:  Recurrent Context comment:  Had fever over a week ago and negative covid tests. States still waking up with cough at times. No sputum production. No fever recently. Still thinks maybe covid and wants tests. Has heartburn symptoms at time. No chest pain or SOB. Relieved by:  Nothing Worsened by:  Nothing Associated symptoms: fever   Associated symptoms: no chest pain, no chills, no ear pain, no rash, no shortness of breath and no sore throat   Risk factors: no recent infection   Fever Associated symptoms: cough   Associated symptoms: no chest pain, no chills, no dysuria, no ear pain, no rash, no sore throat and no vomiting        Past Medical History:  Diagnosis Date  . Abnormal liver function test    in context of myopericarditis  . Myopericarditis     Patient Active Problem List   Diagnosis Date Noted  . Myopericarditis 10/29/2019  . Elevated liver function tests 10/29/2019  . Thrombocytopenia - transient, resolved 10/29/2019    History reviewed. No pertinent surgical history.     Family History  Problem Relation Age of Onset  . Healthy Mother   . Healthy Father     Social History   Tobacco Use  . Smoking status: Former Games developer  . Smokeless tobacco: Former Clinical biochemist  . Vaping Use: Never used  Substance Use Topics  . Alcohol use: Not Currently  . Drug use: Never    Home Medications Prior to Admission medications   Medication Sig Start Date End Date Taking?  Authorizing Provider  pantoprazole (PROTONIX) 20 MG tablet Take 1 tablet (20 mg total) by mouth daily for 14 days. 04/25/20 05/09/20 Yes Yanky Vanderburg, DO  albuterol (VENTOLIN HFA) 108 (90 Base) MCG/ACT inhaler Inhale 1-2 puffs into the lungs every 6 (six) hours as needed for wheezing or shortness of breath. 04/16/20   Particia Nearing, PA-C  colchicine 0.6 MG tablet Take 1 tablet (0.6 mg total) by mouth daily. For 3 months total. Patient not taking: Reported on 04/16/2020 10/30/19   Lewayne Bunting, MD  ondansetron (ZOFRAN ODT) 4 MG disintegrating tablet Take 1 tablet (4 mg total) by mouth every 8 (eight) hours as needed for nausea or vomiting. 04/16/20   Particia Nearing, PA-C    Allergies    Patient has no known allergies.  Review of Systems   Review of Systems  Constitutional: Positive for fever. Negative for chills.  HENT: Negative for ear pain and sore throat.   Eyes: Negative for pain and visual disturbance.  Respiratory: Positive for cough. Negative for shortness of breath.   Cardiovascular: Negative for chest pain and palpitations.  Gastrointestinal: Negative for abdominal pain and vomiting.  Genitourinary: Negative for dysuria and hematuria.  Musculoskeletal: Negative for arthralgias and back pain.  Skin: Negative for color change and rash.  Neurological: Negative for seizures and syncope.  All other systems reviewed and are negative.   Physical Exam Updated Vital Signs  ED Triage  Vitals  Enc Vitals Group     BP 04/25/20 1405 118/72     Pulse Rate 04/25/20 1405 (!) 105     Resp 04/25/20 1405 16     Temp 04/25/20 1405 98.4 F (36.9 C)     Temp Source 04/25/20 1405 Oral     SpO2 04/25/20 1405 97 %     Weight 04/25/20 1406 125 lb (56.7 kg)     Height 04/25/20 1406 5\' 8"  (1.727 m)     Head Circumference --      Peak Flow --      Pain Score 04/25/20 1406 7     Pain Loc --      Pain Edu? --      Excl. in GC? --     Physical Exam Vitals and nursing note  reviewed.  Constitutional:      General: He is not in acute distress.    Appearance: He is well-developed and well-nourished. He is not ill-appearing.  HENT:     Head: Normocephalic and atraumatic.     Nose: Nose normal.     Mouth/Throat:     Mouth: Mucous membranes are moist.  Eyes:     Extraocular Movements: Extraocular movements intact.     Conjunctiva/sclera: Conjunctivae normal.     Pupils: Pupils are equal, round, and reactive to light.  Cardiovascular:     Rate and Rhythm: Normal rate and regular rhythm.     Pulses: Normal pulses.     Heart sounds: Normal heart sounds. No murmur heard.   Pulmonary:     Effort: Pulmonary effort is normal. No respiratory distress.     Breath sounds: Normal breath sounds.  Abdominal:     Palpations: Abdomen is soft.     Tenderness: There is no abdominal tenderness.  Musculoskeletal:        General: No edema.     Cervical back: Normal range of motion and neck supple.  Skin:    General: Skin is warm and dry.  Neurological:     General: No focal deficit present.     Mental Status: He is alert.  Psychiatric:        Mood and Affect: Mood and affect and mood normal.     ED Results / Procedures / Treatments   Labs (all labs ordered are listed, but only abnormal results are displayed) Labs Reviewed  SARS CORONAVIRUS 2 (TAT 6-24 HRS)    EKG None  Radiology No results found.  Procedures Procedures   Medications Ordered in ED Medications - No data to display  ED Course  I have reviewed the triage vital signs and the nursing notes.  Pertinent labs & imaging results that were available during my care of the patient were reviewed by me and considered in my medical decision making (see chart for details).    MDM Rules/Calculators/A&P                          Richard Richmond is a 25 year old male who presents to the ED with cough.  History of myopericarditis.  Denies any chest pain or shortness of breath.  States that he had a  fever over a week ago and had a negative Covid test shortly afterwards.  Fever has resolved for several days but he still has a cough.  Having some body aches but does admit to some reflux symptoms.  Chest x-ray shows no obvious infection.  No pneumonia.  No  pneumothorax.  Vital signs are unremarkable.  No fever here.  Does not have any PE risk factors and is not hypoxic or having any respiratory symptoms.  Has suspicion that may be his cough is reflux related as when he thinks about it he has had a cough for a long time.  He does get burning sensation at times in his chest.  Overall suspect post viral cough versus may be reflux type cough.  We'll start him on Protonix.  Will refer him to wellness center.  Understands return precautions and discharged from the ED in good condition.  This chart was dictated using voice recognition software.  Despite best efforts to proofread,  errors can occur which can change the documentation meaning.    Final Clinical Impression(s) / ED Diagnoses Final diagnoses:  Cough    Rx / DC Orders ED Discharge Orders         Ordered    pantoprazole (PROTONIX) 20 MG tablet  Daily        04/25/20 1428           [place], Madelaine Bhat, DO 04/25/20 1429

## 2020-04-26 LAB — SARS CORONAVIRUS 2 (TAT 6-24 HRS): SARS Coronavirus 2: NEGATIVE

## 2020-04-29 ENCOUNTER — Encounter (HOSPITAL_COMMUNITY): Payer: Self-pay

## 2020-04-29 ENCOUNTER — Emergency Department (HOSPITAL_COMMUNITY): Payer: Medicaid Other

## 2020-04-29 ENCOUNTER — Other Ambulatory Visit: Payer: Self-pay

## 2020-04-29 ENCOUNTER — Emergency Department (HOSPITAL_COMMUNITY)
Admission: EM | Admit: 2020-04-29 | Discharge: 2020-04-30 | Disposition: A | Discharge status: Home / Self Care | Payer: Medicaid Other | Attending: Emergency Medicine | Admitting: Emergency Medicine

## 2020-04-29 DIAGNOSIS — Z859 Personal history of malignant neoplasm, unspecified: Secondary | ICD-10-CM | POA: Insufficient documentation

## 2020-04-29 DIAGNOSIS — Z20822 Contact with and (suspected) exposure to covid-19: Secondary | ICD-10-CM | POA: Insufficient documentation

## 2020-04-29 DIAGNOSIS — R079 Chest pain, unspecified: Secondary | ICD-10-CM | POA: Insufficient documentation

## 2020-04-29 DIAGNOSIS — Z87891 Personal history of nicotine dependence: Secondary | ICD-10-CM | POA: Insufficient documentation

## 2020-04-29 DIAGNOSIS — R0602 Shortness of breath: Secondary | ICD-10-CM | POA: Insufficient documentation

## 2020-04-29 DIAGNOSIS — R109 Unspecified abdominal pain: Secondary | ICD-10-CM | POA: Insufficient documentation

## 2020-04-29 LAB — BASIC METABOLIC PANEL
Anion gap: 9 (ref 5–15)
BUN: 14 mg/dL (ref 6–20)
CO2: 26 mmol/L (ref 22–32)
Calcium: 9.7 mg/dL (ref 8.9–10.3)
Chloride: 100 mmol/L (ref 98–111)
Creatinine, Ser: 0.82 mg/dL (ref 0.61–1.24)
GFR, Estimated: >60 mL/min (ref >60)
Glucose, Bld: 94 mg/dL (ref 70–99)
Potassium: 3.9 mmol/L (ref 3.5–5.1)
Sodium: 135 mmol/L (ref 135–145)

## 2020-04-29 LAB — CBC
HCT: 40.1 % (ref 39.0–52.0)
Hemoglobin: 13.5 g/dL (ref 13.0–17.0)
MCH: 27.7 pg (ref 26.0–34.0)
MCHC: 33.7 g/dL (ref 30.0–36.0)
MCV: 82.2 fL (ref 80.0–100.0)
Platelets: 316 10*3/uL (ref 150–400)
RBC: 4.88 MIL/uL (ref 4.22–5.81)
RDW: 12 % (ref 11.5–15.5)
WBC: 9 10*3/uL (ref 4.0–10.5)
nRBC: 0 % (ref 0.0–0.2)

## 2020-04-29 LAB — D-DIMER, QUANTITATIVE: D-Dimer, Quant: <0.27 ug/mL-FEU (ref 0.00–0.50)

## 2020-04-29 LAB — HEPATIC FUNCTION PANEL
ALT: 16 U/L (ref 0–44)
AST: 22 U/L (ref 15–41)
Albumin: 4.7 g/dL (ref 3.5–5.0)
Alkaline Phosphatase: 63 U/L (ref 38–126)
Bilirubin, Direct: 0.2 mg/dL (ref 0.0–0.2)
Indirect Bilirubin: 0.7 mg/dL (ref 0.3–0.9)
Total Bilirubin: 0.9 mg/dL (ref 0.3–1.2)
Total Protein: 8.1 g/dL (ref 6.5–8.1)

## 2020-04-29 LAB — TROPONIN I (HIGH SENSITIVITY): Troponin I (High Sensitivity): 2 ng/L (ref <18)

## 2020-04-29 IMAGING — CR DG CHEST 2V
2 series · 2 of 2 positions shown · non-contrast
Comparison: Radiograph 4 days ago

CLINICAL DATA: Chest pain and shortness of breath.

EXAM:
CHEST - 2 VIEW

[w chest pa]
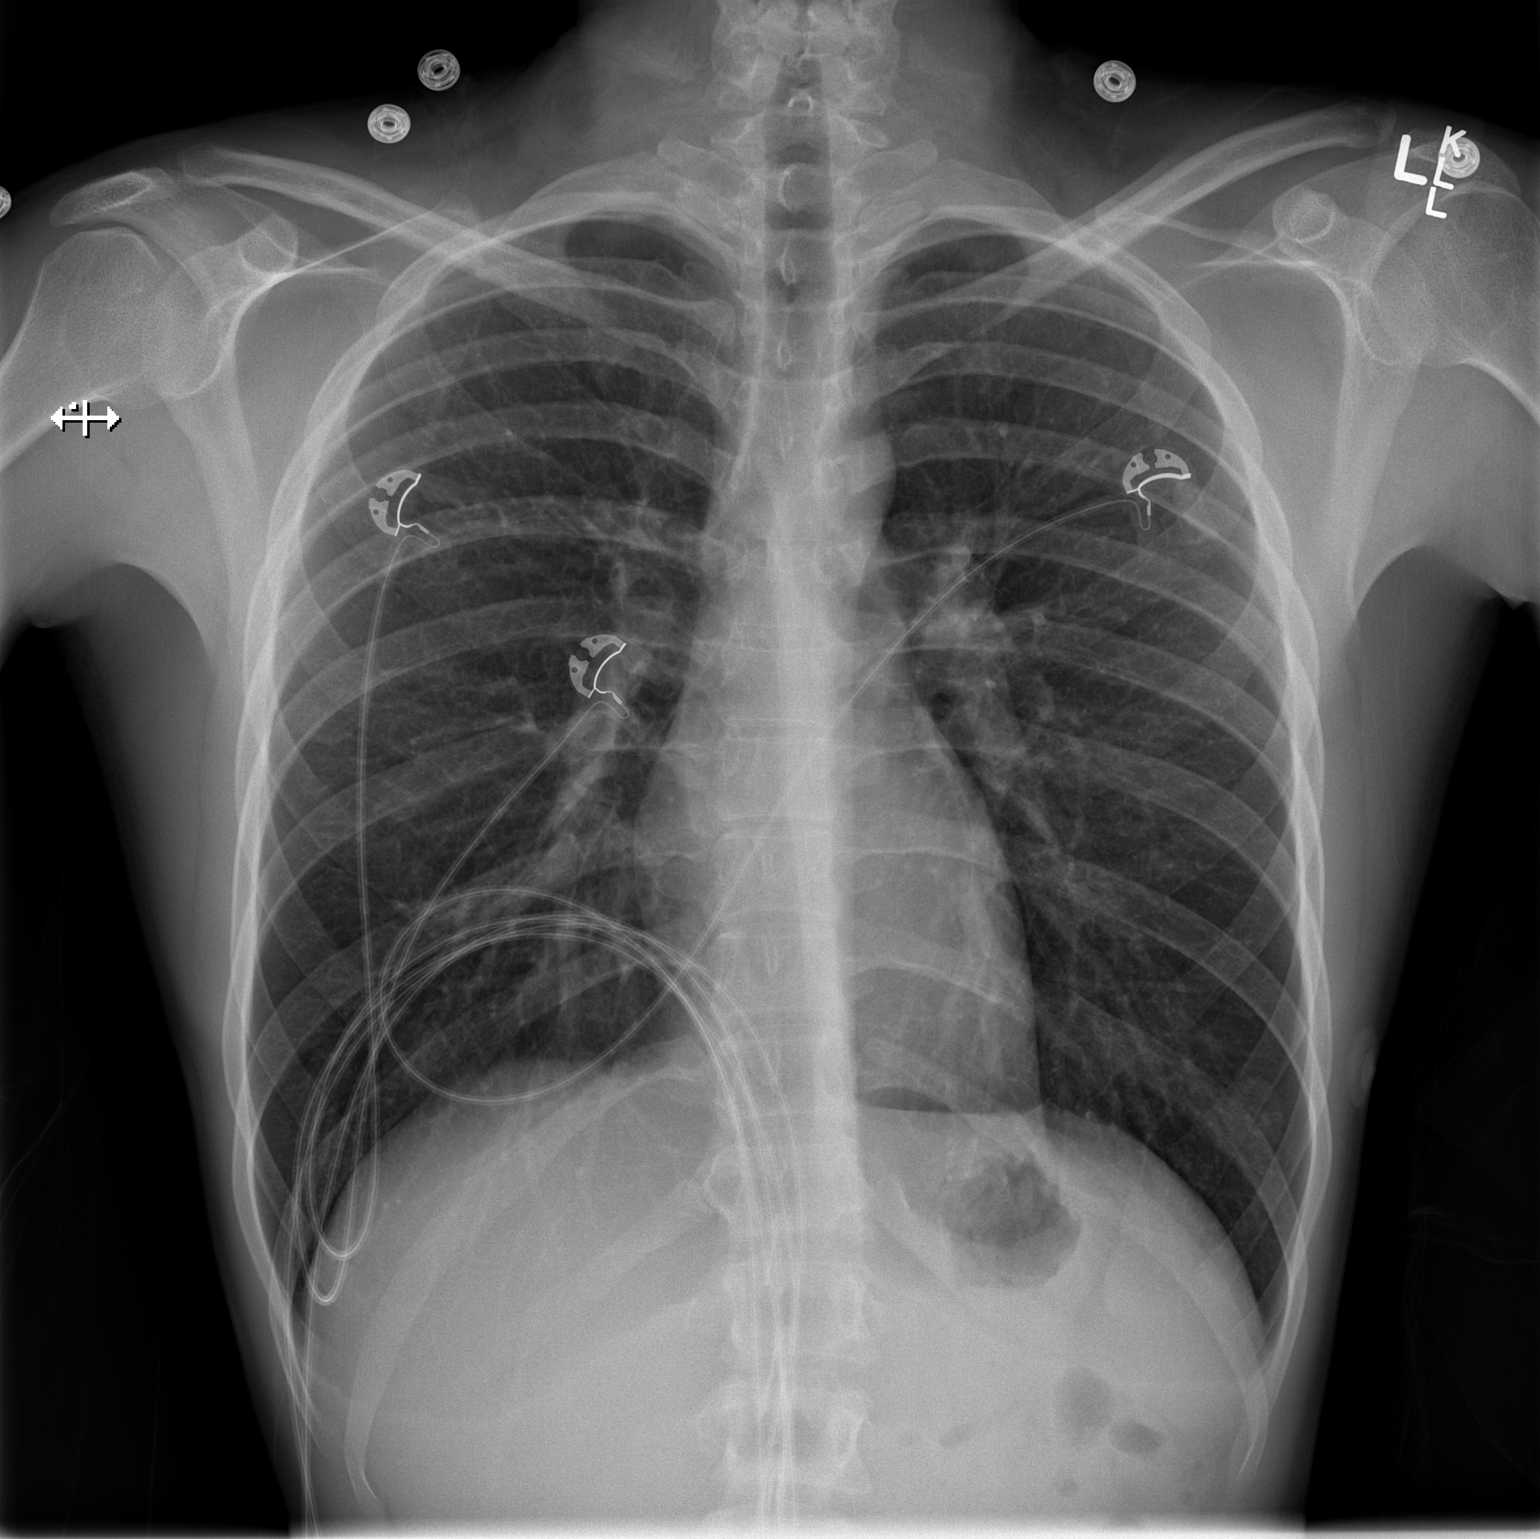

[w chest lat]
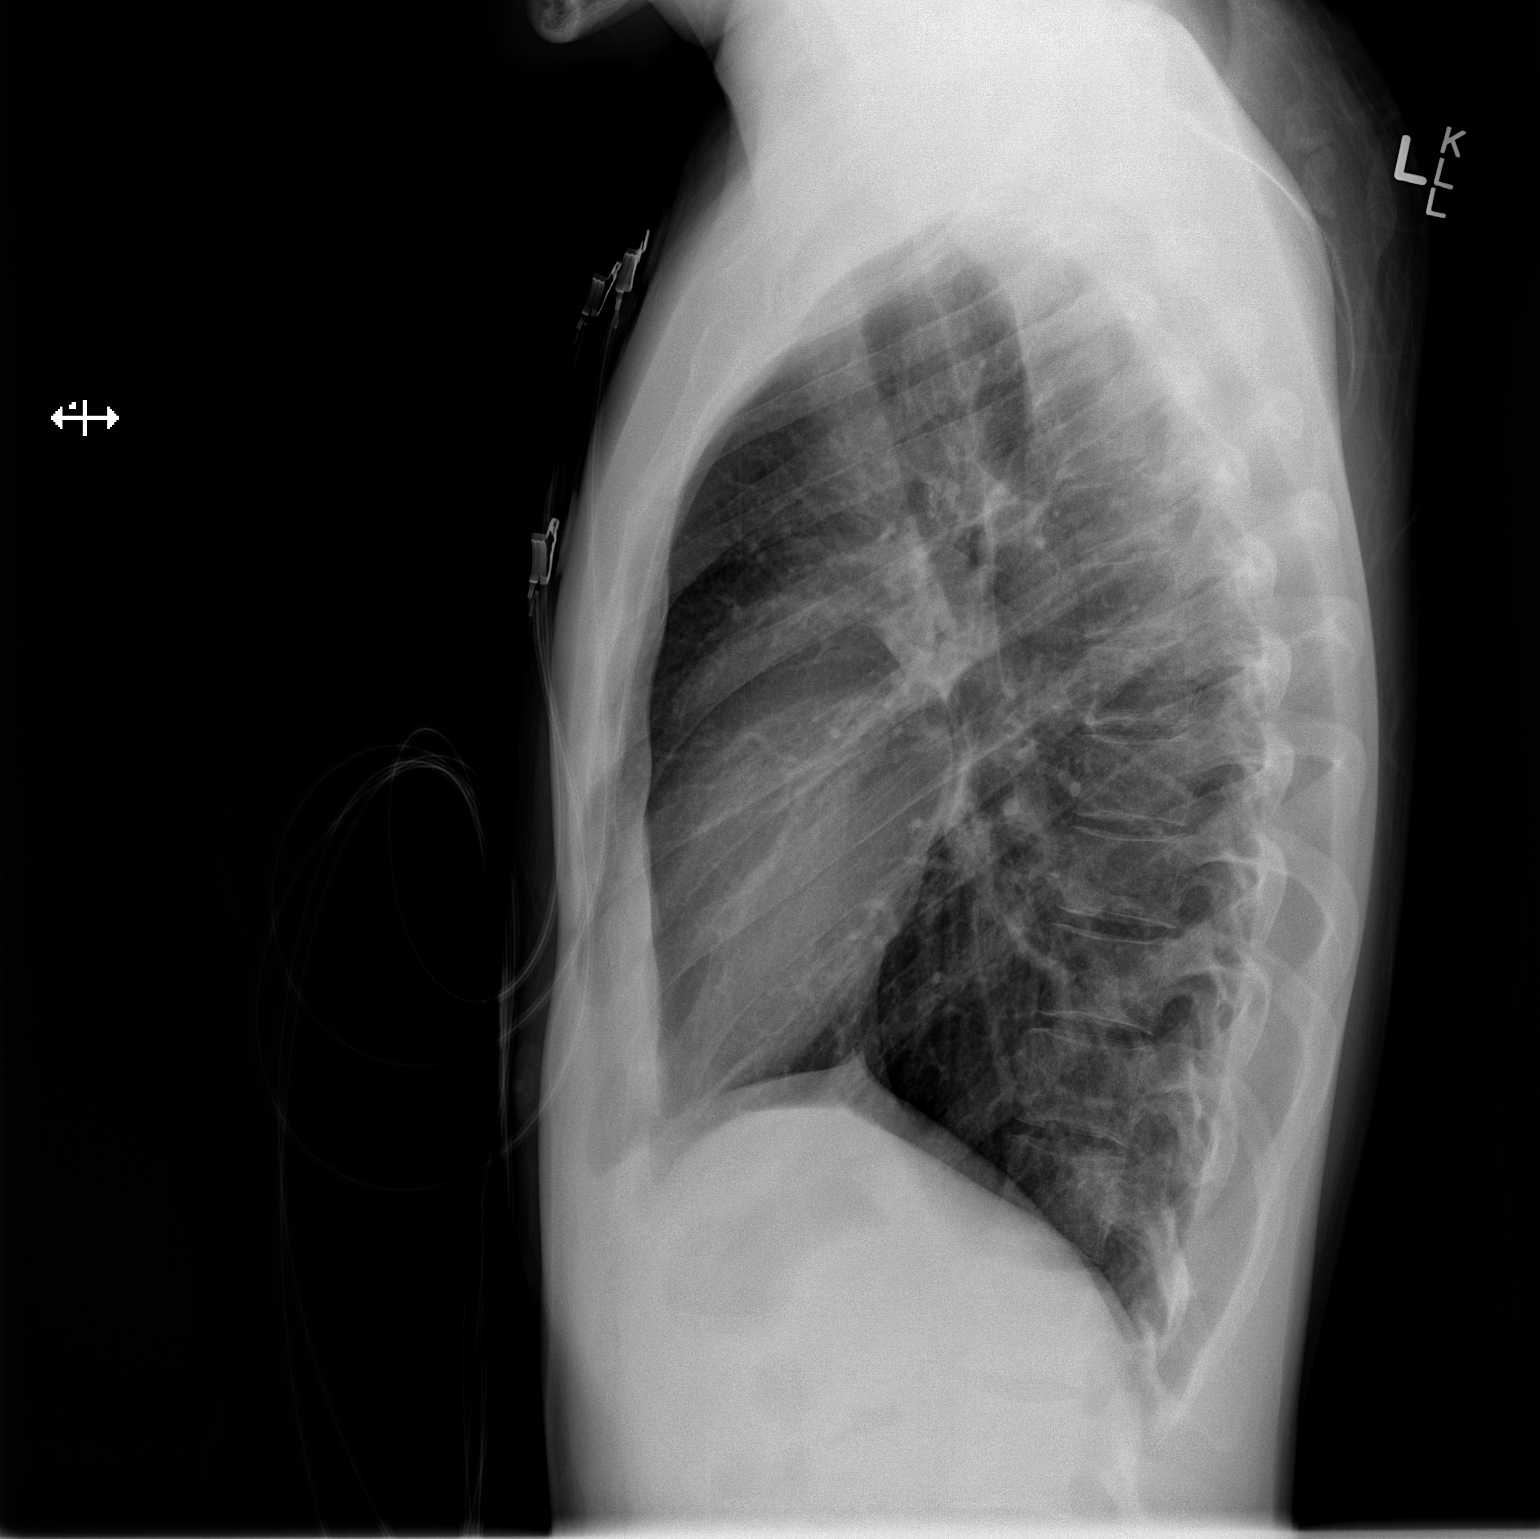

[2 of 2 positions shown; findings below may reference images not displayed]

FINDINGS: The cardiomediastinal contours are normal. The lungs are clear.
Pulmonary vasculature is normal. No consolidation, pleural effusion,
or pneumothorax. No acute osseous abnormalities are seen.
IMPRESSION: Negative radiographs of the chest.

## 2020-04-29 NOTE — ED Triage Notes (Signed)
Pt reports chest pain, shob, generalized abdominal pain and light blood toilet paper when wiping.

## 2020-04-29 NOTE — ED Provider Notes (Signed)
[facility] Provider Note   CSN: [id number] Arrival date & time: 04/29/20  2116     History Chief Complaint  Patient presents with  . Chest Pain    Richard Richmond is a 25 y.o. male.  HPI     25 year old male comes in a chief complaint of chest pain.  He has history of elevated LFTs and myopericarditis. Patient reports that he has been feeling off-and-on chest pain for the last several months.  The chest pain is located towards the top of his chest and sometimes moves towards the left side.  The pain is intermittent with no specific evoking factors.  When present, the pain is worse with exertion and he has associated shortness of breath.  The pain is described as sharp pain.  There is no associated cough, nausea, vomiting, fevers.   Pt has no hx of PE, DVT and denies any exogenous hormone (testosterone / estrogen) use, long distance travels or surgery in the past 6 weeks, active cancer, recent immobilization.  Patient denies any active smoking, drug use.  There is no family history of premature CAD.  The myopericarditis was treated in the hospital.  He has not seen cardiology since.  Of note patient also reports that he has had some off-and-on abdominal discomfort and intermittent episodes of blood on the tissue when he cleans himself post defecation.  The symptoms have been present for over a year.  Patient unfortunately does not have insurance or PCP.  Denies any unexplained weight loss, arthralgias, family history of autoimmune GI conditions  Past Medical History:  Diagnosis Date  . Abnormal liver function test    in context of myopericarditis  . Myopericarditis     Patient Active Problem List   Diagnosis Date Noted  . Myopericarditis 10/29/2019  . Elevated liver function tests 10/29/2019  . Thrombocytopenia - transient, resolved 10/29/2019    History reviewed. No pertinent surgical history.     Family History  Problem Relation  Age of Onset  . Healthy Mother   . Healthy Father     Social History   Tobacco Use  . Smoking status: Former Games developer  . Smokeless tobacco: Former Clinical biochemist  . Vaping Use: Never used  Substance Use Topics  . Alcohol use: Not Currently  . Drug use: Never    Home Medications Prior to Admission medications   Medication Sig Start Date End Date Taking? Authorizing Provider  albuterol (VENTOLIN HFA) 108 (90 Base) MCG/ACT inhaler Inhale 1-2 puffs into the lungs every 6 (six) hours as needed for wheezing or shortness of breath. 04/16/20   Particia Nearing, PA-C  colchicine 0.6 MG tablet Take 1 tablet (0.6 mg total) by mouth daily. For 3 months total. Patient not taking: Reported on 04/16/2020 10/30/19   Lewayne Bunting, MD  ondansetron (ZOFRAN ODT) 4 MG disintegrating tablet Take 1 tablet (4 mg total) by mouth every 8 (eight) hours as needed for nausea or vomiting. 04/16/20   Particia Nearing, PA-C  pantoprazole (PROTONIX) 20 MG tablet Take 1 tablet (20 mg total) by mouth daily for 14 days. 04/25/20 05/09/20  Virgina Norfolk, DO    Allergies    Patient has no known allergies.  Review of Systems   Review of Systems  Constitutional: Positive for activity change.  Respiratory: Positive for shortness of breath. Negative for cough.   Cardiovascular: Positive for chest pain.  Gastrointestinal: Positive for anal bleeding. Negative for nausea and vomiting.  Allergic/Immunologic:  Negative for immunocompromised state.  Hematological: Does not bruise/bleed easily.  All other systems reviewed and are negative.   Physical Exam Updated Vital Signs BP 116/81   Pulse 85   Temp 98.5 F (36.9 C) (Oral)   Resp 17   Ht 5\' 6"  (1.676 m)   Wt 54.4 kg   SpO2 98%   BMI 19.37 kg/m   Physical Exam Vitals and nursing note reviewed.  Constitutional:      Appearance: He is well-developed.  HENT:     Head: Atraumatic.  Cardiovascular:     Rate and Rhythm: Normal rate.     Heart  sounds: Normal heart sounds.  Pulmonary:     Effort: Pulmonary effort is normal.  Musculoskeletal:     Cervical back: Neck supple.  Skin:    General: Skin is warm.  Neurological:     Mental Status: He is alert and oriented to person, place, and time.     ED Results / Procedures / Treatments   Labs (all labs ordered are listed, but only abnormal results are displayed) Labs Reviewed  CBC  BASIC METABOLIC PANEL  BRAIN NATRIURETIC PEPTIDE  HEPATIC FUNCTION PANEL  TROPONIN I (HIGH SENSITIVITY)  TROPONIN I (HIGH SENSITIVITY)    EKG EKG Interpretation  Date/Time:  Thursday April 29 2020 21:28:30 EST Ventricular Rate:  92 PR Interval:    QRS Duration: 74 QT Interval:  337 QTC Calculation: 417 R Axis:   87 Text Interpretation: Sinus rhythm No acute changes improved ST elevation compared to prior EKG Confirmed by 03-[birth year]-1995 [id number]) on 04/29/2020 10:51:51 PM   Radiology DG Chest 2 View  Result Date: 04/29/2020 CLINICAL DATA:  Chest pain and shortness of breath. EXAM: CHEST - 2 VIEW COMPARISON:  Radiograph 4 days ago FINDINGS: The cardiomediastinal contours are normal. The lungs are clear. Pulmonary vasculature is normal. No consolidation, pleural effusion, or pneumothorax. No acute osseous abnormalities are seen. IMPRESSION: Negative radiographs of the chest. Electronically Signed   By: 06/29/2020 M.D.   On: 04/29/2020 22:06    Procedures Procedures   Medications Ordered in ED Medications - No data to display  ED Course  I have reviewed the triage vital signs and the nursing notes.  Pertinent labs & imaging results that were available during my care of the patient were reviewed by me and considered in my medical decision making (see chart for details).    MDM Rules/Calculators/A&P                          Richard Richmond was evaluated in Emergency Department on 04/29/2020 for the symptoms described in the history of present illness. He was evaluated in the  context of the global COVID-19 pandemic, which necessitated consideration that the patient might be at risk for infection with the SARS-CoV-2 virus that causes COVID-19. Institutional protocols and algorithms that pertain to the evaluation of patients at risk for COVID-19 are in a state of rapid change based on information released by regulatory bodies including the CDC and federal and state organizations. These policies and algorithms were followed during the patient's care in the ED.   25 year old male comes in with chief complaint of chest pain and shortness of breath.  He has history of myopericarditis last year.  He had CT PE at that time which was negative.  Patient does not have any ACS risk factors, clinical concerns for ACS is low.  Plan is to get delta troponin  to rule out ACS. Clinically, it does not appear that the symptoms are GI in nature.  He was taking PPI and they were not helping. Patient is PERC negative, with a recent PE study that was negative as well, will not pursue that diagnosis.  Chest x-ray ordered, no pneumonia. Patient allegedly had a negative Covid test with fevers last week.  Since he was a home test, we will get a PCR test while here.  GI symptoms are nonspecific, not acute.  I reviewed his labs.  He had elevated inflammatory markers and elevated LFTs at the time of admission for his myopericarditis.  All of this could be because of inflammatory response to viral infection or vaccine -but we are also considering IBD in the differential.  Given that he does not have a PCP or insurance, we have requested [facility] follow-up.  At 1115, patient's care is being assumed by Dr. Manus Gunning.  Final Clinical Impression(s) / ED Diagnoses Final diagnoses:  None    Rx / DC Orders ED Discharge Orders    None       Derwood Kaplan, MD 04/29/20 2318

## 2020-04-29 NOTE — Discharge Instructions (Addendum)
We saw you in the ER for the chest pain/shortness of breath. All of our cardiac workup is normal, including labs, EKG and chest X-RAY are normal. We are not sure what is causing your discomfort, but we feel comfortable sending you home at this time. The workup in the ER is not complete, and you should follow up with your primary care doctor for further evaluation.  It is prudent that you call the [facility] wellness number and see them whenever your appointment is scheduled.  Return to the ER if you start having severe chest pain, fainting spell, palpitations, severe dizziness, bloody stools, bloody vomiting.

## 2020-04-30 LAB — TROPONIN I (HIGH SENSITIVITY): Troponin I (High Sensitivity): <2 ng/L (ref <18)

## 2020-04-30 LAB — SARS CORONAVIRUS 2 (TAT 6-24 HRS): SARS Coronavirus 2: NEGATIVE

## 2020-04-30 LAB — BRAIN NATRIURETIC PEPTIDE: B Natriuretic Peptide: 48 pg/mL (ref 0.0–100.0)

## 2020-04-30 NOTE — ED Notes (Signed)
Pt verbalized understanding of d/c, and follow up care. Ambulatory with steady gait.  

## 2020-04-30 NOTE — ED Provider Notes (Signed)
Care assumed from Dr. Rhunette Croft. Patient with history of myopericarditis here with intermittent chest pain for the past several months that is intermittent on the left side and last for several hours at a time.  He is awaiting repeat troponin as well as D-dimer.  EKG is improved from previous.  Troponin remains negative x2.  Ultrasound shows no pericardial effusion. Physical Exam  BP 113/69   Pulse 69   Temp 98.5 F (36.9 C) (Oral)   Resp 15   Ht 5\' 6"  (1.676 m)   Wt 54.4 kg   SpO2 98%   BMI 19.37 kg/m   ED Course/Procedures     Ultrasound ED Echo  Date/Time: 04/30/2020 1:14 AM Performed by: 06/30/2020, MD Authorized by: Glynn Octave, MD   Procedure details:    Indications: chest pain     Views: subxiphoid, parasternal long axis view and parasternal short axis view     Images: archived   Findings:    Pericardium: no pericardial effusion     LV Function: normal (>50% EF)     RV Diameter: normal   Impression:    Impression: normal      Plan remains negative.  Chest pain is atypical for ACS.  No pericardial effusion.  Ejection fraction has recovered on most recent MRI.  Chest pain is atypical for ACS, PE, aortic dissection.  Patient to follow-up with PCP as well as cardiology.  Return precautions discussed       Glynn Octave, MD 04/30/20 0126

## 2020-05-04 ENCOUNTER — Other Ambulatory Visit: Payer: Self-pay

## 2020-05-04 ENCOUNTER — Emergency Department (HOSPITAL_COMMUNITY): Payer: Medicaid Other

## 2020-05-04 ENCOUNTER — Encounter (HOSPITAL_COMMUNITY): Payer: Self-pay | Admitting: Emergency Medicine

## 2020-05-04 ENCOUNTER — Emergency Department (HOSPITAL_COMMUNITY)
Admission: EM | Admit: 2020-05-04 | Discharge: 2020-05-04 | Disposition: A | Discharge status: Home / Self Care | Payer: Medicaid Other | Attending: Emergency Medicine | Admitting: Emergency Medicine

## 2020-05-04 DIAGNOSIS — R002 Palpitations: Secondary | ICD-10-CM | POA: Insufficient documentation

## 2020-05-04 DIAGNOSIS — Z87891 Personal history of nicotine dependence: Secondary | ICD-10-CM | POA: Insufficient documentation

## 2020-05-04 DIAGNOSIS — R0789 Other chest pain: Secondary | ICD-10-CM | POA: Insufficient documentation

## 2020-05-04 DIAGNOSIS — R42 Dizziness and giddiness: Secondary | ICD-10-CM | POA: Insufficient documentation

## 2020-05-04 DIAGNOSIS — R1013 Epigastric pain: Secondary | ICD-10-CM | POA: Insufficient documentation

## 2020-05-04 LAB — COMPREHENSIVE METABOLIC PANEL
ALT: 16 U/L (ref 0–44)
AST: 21 U/L (ref 15–41)
Albumin: 4.4 g/dL (ref 3.5–5.0)
Alkaline Phosphatase: 62 U/L (ref 38–126)
Anion gap: 7 (ref 5–15)
BUN: 11 mg/dL (ref 6–20)
CO2: 28 mmol/L (ref 22–32)
Calcium: 10.6 mg/dL — ABNORMAL HIGH (ref 8.9–10.3)
Chloride: 105 mmol/L (ref 98–111)
Creatinine, Ser: 0.87 mg/dL (ref 0.61–1.24)
GFR, Estimated: >60 mL/min (ref >60)
Glucose, Bld: 89 mg/dL (ref 70–99)
Potassium: 4 mmol/L (ref 3.5–5.1)
Sodium: 140 mmol/L (ref 135–145)
Total Bilirubin: 0.9 mg/dL (ref 0.3–1.2)
Total Protein: 7.9 g/dL (ref 6.5–8.1)

## 2020-05-04 LAB — URINALYSIS, ROUTINE W REFLEX MICROSCOPIC
Bacteria, UA: NONE SEEN
Bilirubin Urine: NEGATIVE
Glucose, UA: NEGATIVE mg/dL
Ketones, ur: NEGATIVE mg/dL
Leukocytes,Ua: NEGATIVE
Nitrite: NEGATIVE
Protein, ur: NEGATIVE mg/dL
Specific Gravity, Urine: 1.002 — ABNORMAL LOW (ref 1.005–1.030)
pH: 7 (ref 5.0–8.0)

## 2020-05-04 LAB — CBC
HCT: 40.1 % (ref 39.0–52.0)
Hemoglobin: 13.8 g/dL (ref 13.0–17.0)
MCH: 28.2 pg (ref 26.0–34.0)
MCHC: 34.4 g/dL (ref 30.0–36.0)
MCV: 81.8 fL (ref 80.0–100.0)
Platelets: 327 10*3/uL (ref 150–400)
RBC: 4.9 MIL/uL (ref 4.22–5.81)
RDW: 12.3 % (ref 11.5–15.5)
WBC: 4.2 10*3/uL (ref 4.0–10.5)
nRBC: 0 % (ref 0.0–0.2)

## 2020-05-04 LAB — TROPONIN I (HIGH SENSITIVITY): Troponin I (High Sensitivity): <2 ng/L (ref <18)

## 2020-05-04 LAB — LIPASE, BLOOD: Lipase: 31 U/L (ref 11–51)

## 2020-05-04 IMAGING — DX DG CHEST 2V
2 series · 2 of 2 positions shown · non-contrast
Comparison: [DATE]

CLINICAL DATA: Chest and abdominal pain over the last month.

EXAM:
CHEST - 2 VIEW

[w chest pa]
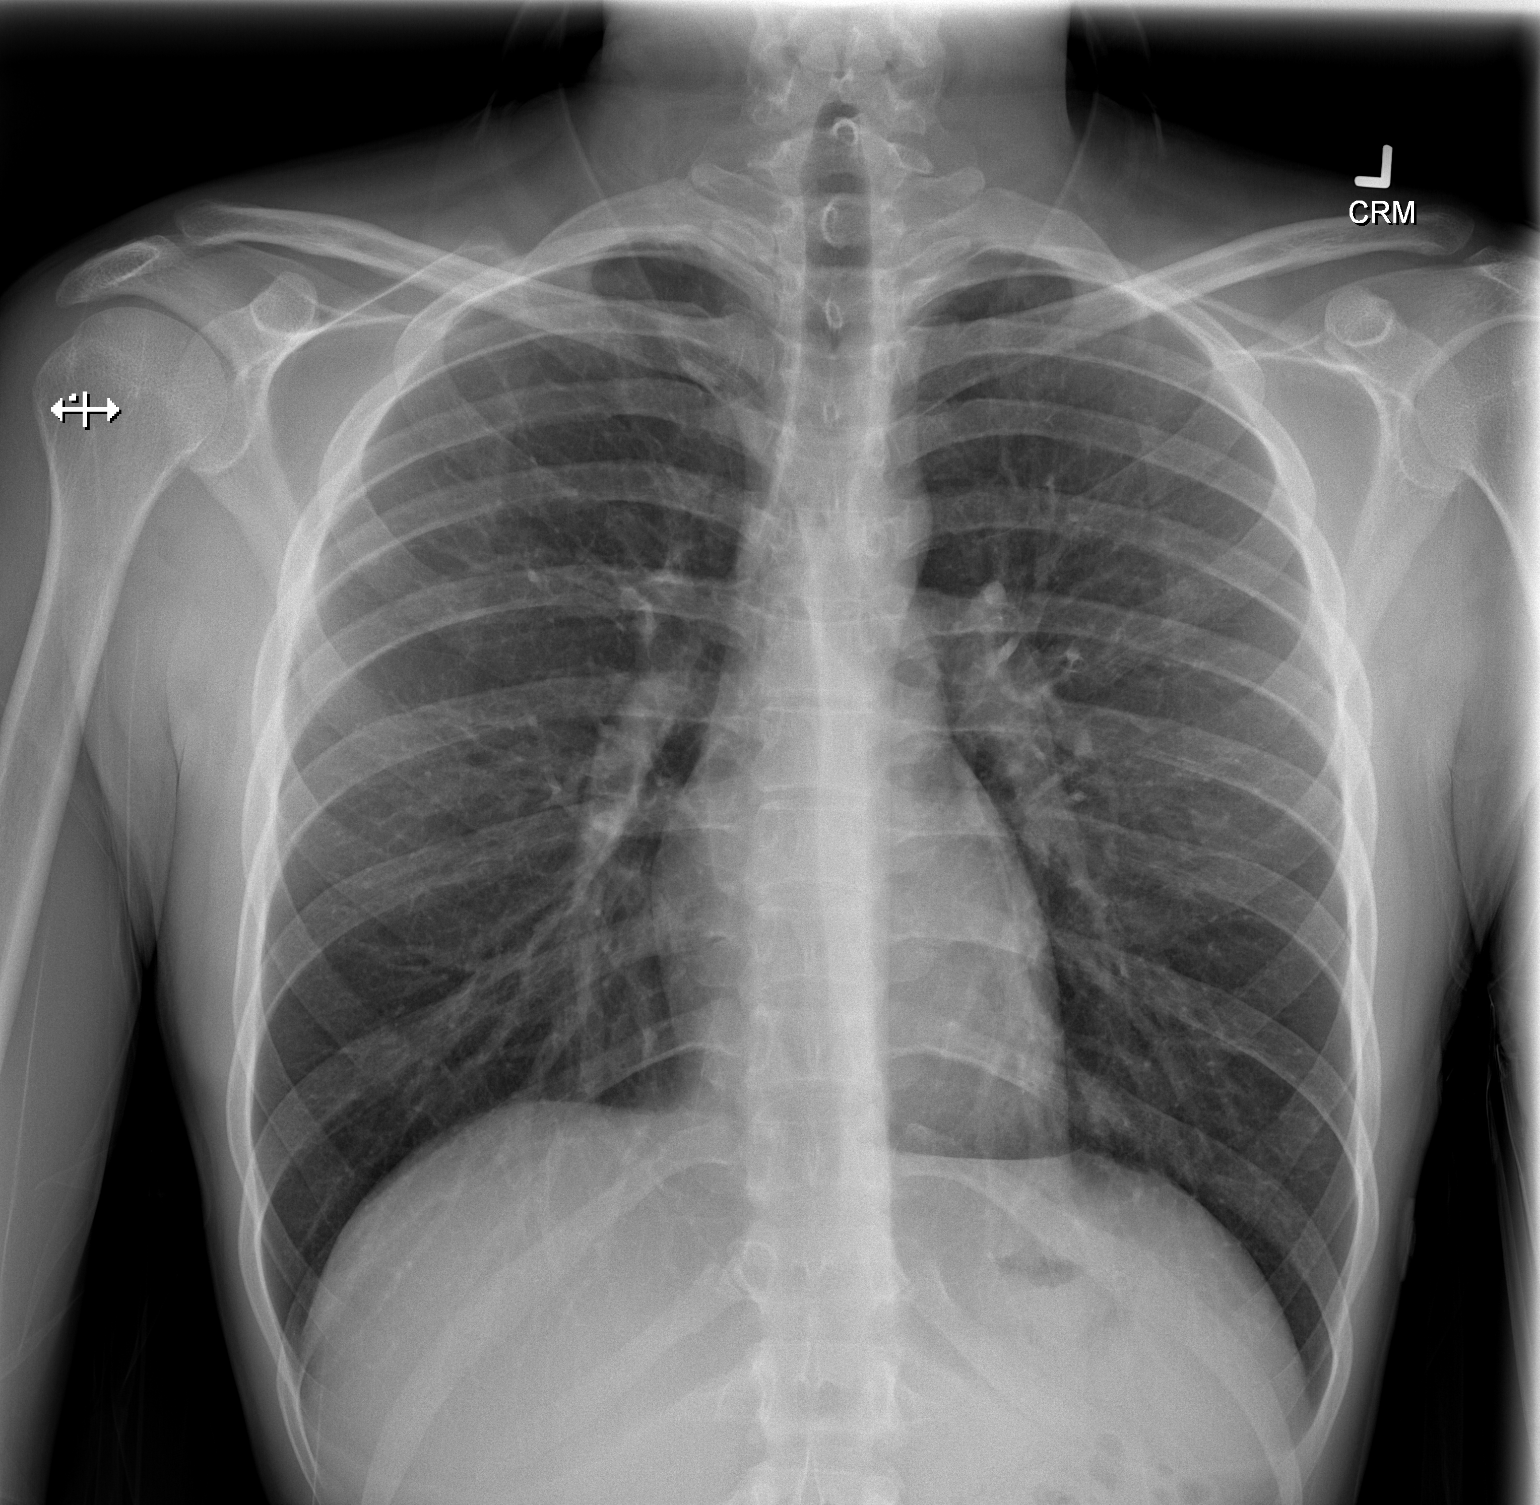

[w chest lat]
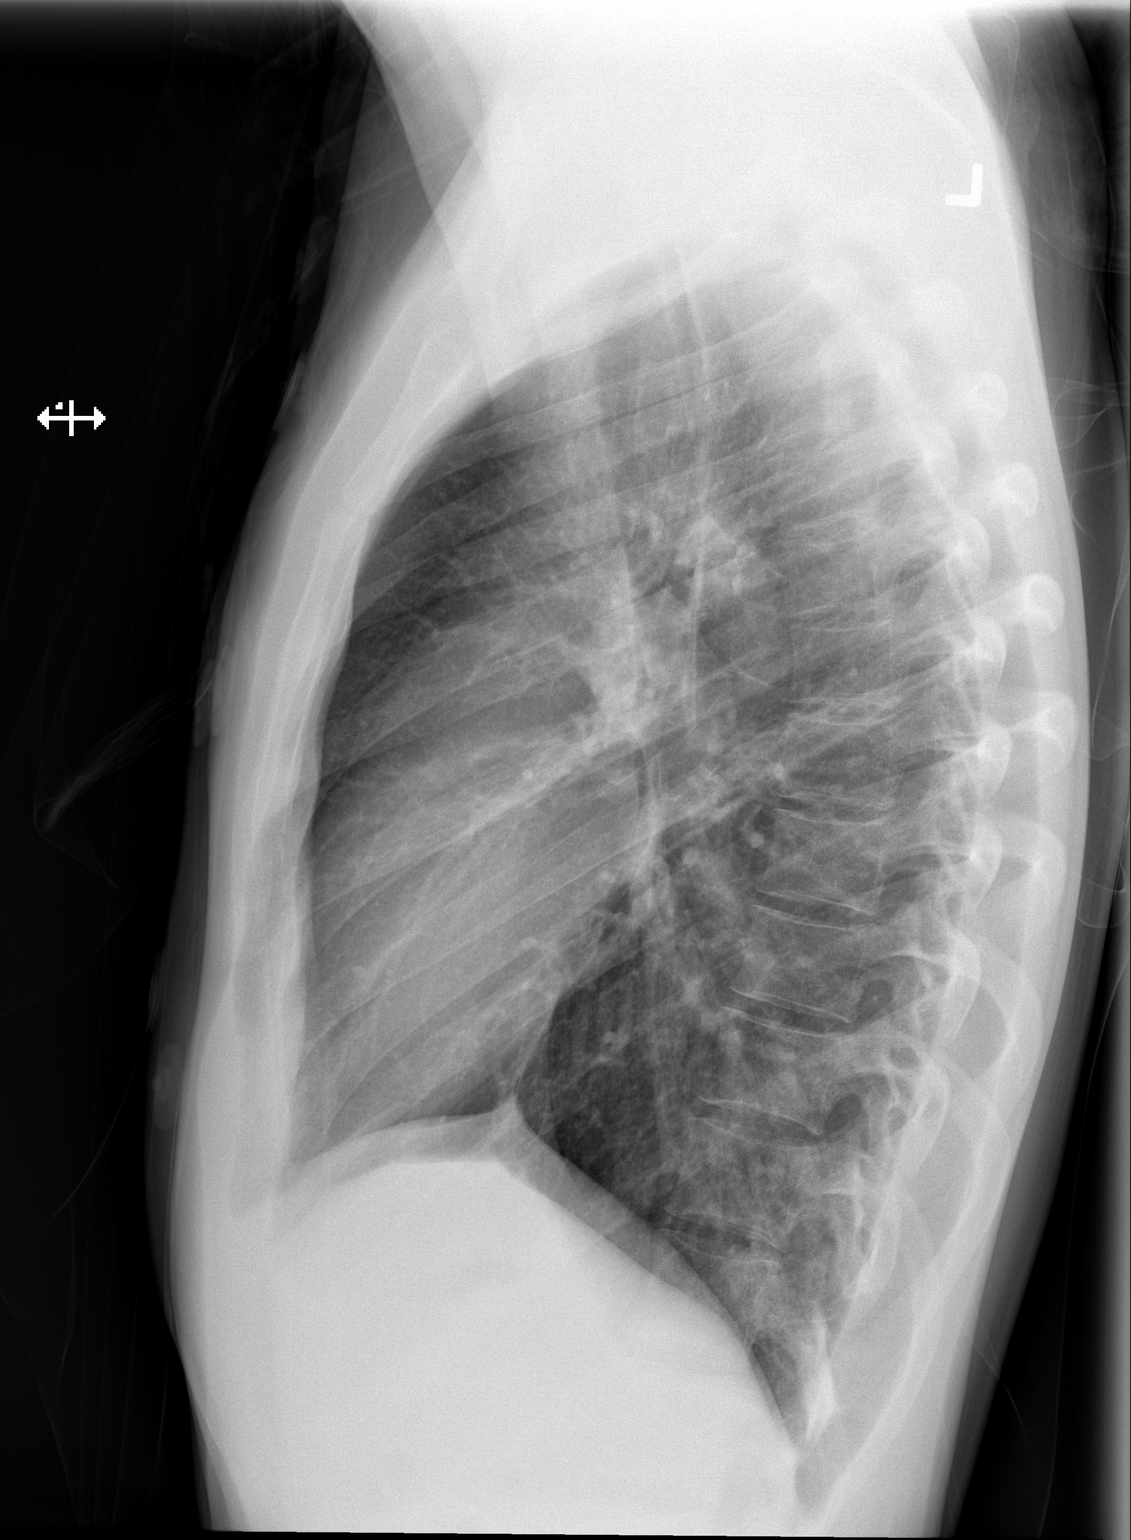

[2 of 2 positions shown; findings below may reference images not displayed]

FINDINGS: Heart size is normal. Mediastinal shadows are normal. The lungs are
clear. No bronchial thickening. No infiltrate, mass, effusion or
collapse. Pulmonary vascularity is normal. No bony abnormality.
IMPRESSION: Normal chest.

## 2020-05-04 MED ORDER — ALUM & MAG HYDROXIDE-SIMETH 200-200-20 MG/5ML PO SUSP
30.0000 mL | Freq: Once | ORAL | Status: AC
  Administered 2020-05-04: 30 mL via ORAL
  Filled 2020-05-04: qty 30

## 2020-05-04 MED ORDER — KETOROLAC TROMETHAMINE 30 MG/ML IJ SOLN
30.0000 mg | Freq: Once | INTRAMUSCULAR | Status: DC
  Filled 2020-05-04: qty 1

## 2020-05-04 MED ORDER — FAMOTIDINE 20 MG PO TABS: 20.0000 mg | ORAL_TABLET | Freq: Two times a day (BID) | ORAL | 0 refills | Status: DC

## 2020-05-04 MED ORDER — KETOROLAC TROMETHAMINE 30 MG/ML IJ SOLN
30.0000 mg | Freq: Once | INTRAMUSCULAR | Status: AC
  Administered 2020-05-04: 30 mg via INTRAMUSCULAR

## 2020-05-04 MED ORDER — LIDOCAINE VISCOUS HCL 2 % MT SOLN
15.0000 mL | Freq: Once | OROMUCOSAL | Status: AC
  Administered 2020-05-04: 15 mL via ORAL
  Filled 2020-05-04: qty 15

## 2020-05-04 NOTE — Discharge Instructions (Signed)
You were hospitalized at [facility] due to chest and abdominal pain. We are so glad you are feeling better.  Be sure to follow-up with your cardiologist  Please also be sure to stop by the pharmacy to pick up your medication.  Thank you for allowing [place] to take care of you.  Take care,  Dr. Katherina Right Emergency Department

## 2020-05-04 NOTE — ED Triage Notes (Signed)
Onset one month ago developed chest pain and abdominal pain. Seen doctor multiple times and concerned pain continuing with dizziness.

## 2020-05-04 NOTE — ED Provider Notes (Signed)
[facility] EMERGENCY DEPARTMENT Provider Note   CSN: [id number] Arrival date & time: 05/04/20  1246     History Chief Complaint  Patient presents with  . Chest Pain  . Abdominal Pain    Richard Richmond is a 25 y.o. male.  HPI   Pt has had intermittent chest pain for the past month.  Pt reports he was at work today and started having sharp subcoastal chest pain and burning abdominal pain. Reports brief episode palpitations and lightheadedness. Denies cough, fever, shortness of breath, diaphoresis, numbness, tingling, exertional dyspnea.  No change in pain with leaning forward.      Past Medical History:  Diagnosis Date  . Abnormal liver function test    in context of myopericarditis  . Myopericarditis     Patient Active Problem List   Diagnosis Date Noted  . Myopericarditis 10/29/2019  . Elevated liver function tests 10/29/2019  . Thrombocytopenia - transient, resolved 10/29/2019    History reviewed. No pertinent surgical history.     Family History  Problem Relation Age of Onset  . Healthy Mother   . Healthy Father     Social History   Tobacco Use  . Smoking status: Former Games developer  . Smokeless tobacco: Former Clinical biochemist  . Vaping Use: Never used  Substance Use Topics  . Alcohol use: Not Currently  . Drug use: Never    Home Medications Prior to Admission medications   Medication Sig Start Date End Date Taking? Authorizing Provider  famotidine (PEPCID) 20 MG tablet Take 1 tablet (20 mg total) by mouth 2 (two) times daily for 14 days. 05/04/20 05/18/20 Yes , , DO  albuterol (VENTOLIN HFA) 108 (90 Base) MCG/ACT inhaler Inhale 1-2 puffs into the lungs every 6 (six) hours as needed for wheezing or shortness of breath. 04/16/20   Particia Nearing, PA-C  colchicine 0.6 MG tablet Take 1 tablet (0.6 mg total) by mouth daily. For 3 months total. Patient not taking: No sig reported 10/30/19   Lewayne Bunting, MD   ondansetron (ZOFRAN ODT) 4 MG disintegrating tablet Take 1 tablet (4 mg total) by mouth every 8 (eight) hours as needed for nausea or vomiting. 04/16/20   Particia Nearing, PA-C  pantoprazole (PROTONIX) 20 MG tablet Take 1 tablet (20 mg total) by mouth daily for 14 days. 04/25/20 05/04/20  Virgina Norfolk, DO    Allergies    Patient has no known allergies.  Review of Systems   Review of Systems  Constitutional: Negative for diaphoresis and fever.  HENT: Negative for congestion, rhinorrhea and sore throat.   Respiratory: Negative for cough and shortness of breath.   Cardiovascular: Positive for chest pain and palpitations. Negative for leg swelling.  Gastrointestinal: Positive for abdominal pain. Negative for blood in stool, nausea and vomiting.  Genitourinary: Negative for dysuria.  Musculoskeletal: Negative for back pain and neck pain.  Neurological: Positive for light-headedness. Negative for weakness and headaches.  All other systems reviewed and are negative.   Physical Exam Updated Vital Signs BP 108/65 (BP Location: Left Arm)   Pulse 75   Temp 98.2 F (36.8 C) (Oral)   Resp 18   Ht 5\' 10"  (1.778 m)   Wt 56.7 kg   SpO2 96%   BMI 17.94 kg/m   Physical Exam Vitals and nursing note reviewed.  Constitutional:      General: He is not in acute distress.    Appearance: He is well-developed. He is not  ill-appearing.  HENT:     Head: Normocephalic and atraumatic.  Eyes:     Extraocular Movements: Extraocular movements intact.     Conjunctiva/sclera: Conjunctivae normal.  Neck:     Vascular: No JVD.  Cardiovascular:     Rate and Rhythm: Normal rate and regular rhythm.     Pulses:          Radial pulses are 2+ on the right side and 2+ on the left side.       Dorsalis pedis pulses are 2+ on the right side and 2+ on the left side.     Heart sounds: Normal heart sounds. Heart sounds not distant. No murmur heard.   Pulmonary:     Effort: Pulmonary effort is normal. No  respiratory distress.     Breath sounds: Normal breath sounds. No decreased breath sounds.  Abdominal:     Palpations: Abdomen is soft.     Tenderness: There is no abdominal tenderness. There is no guarding or rebound. Negative signs include Murphy's sign, Rovsing's sign and McBurney's sign.  Musculoskeletal:     Cervical back: Normal range of motion and neck supple.     Right lower leg: No tenderness. No edema.     Left lower leg: No tenderness. No edema.  Skin:    General: Skin is warm and dry.     Capillary Refill: Capillary refill takes less than 2 seconds.  Neurological:     General: No focal deficit present.     Mental Status: He is alert and oriented to person, place, and time.  Psychiatric:        Mood and Affect: Mood normal.        Behavior: Behavior normal.     ED Results / Procedures / Treatments   Labs (all labs ordered are listed, but only abnormal results are displayed) Labs Reviewed  COMPREHENSIVE METABOLIC PANEL - Abnormal; Notable for the following components:      Result Value   Calcium 10.6 (*)    All other components within normal limits  URINALYSIS, ROUTINE W REFLEX MICROSCOPIC - Abnormal; Notable for the following components:   Color, Urine COLORLESS (*)    Specific Gravity, Urine 1.002 (*)    Hgb urine dipstick SMALL (*)    All other components within normal limits  CBC  LIPASE, BLOOD  TROPONIN I (HIGH SENSITIVITY)    EKG None  Radiology DG Chest 2 View  Result Date: 05/04/2020 CLINICAL DATA:  Chest and abdominal pain over the last month. EXAM: CHEST - 2 VIEW COMPARISON:  04/29/2020 FINDINGS: Heart size is normal. Mediastinal shadows are normal. The lungs are clear. No bronchial thickening. No infiltrate, mass, effusion or collapse. Pulmonary vascularity is normal. No bony abnormality. IMPRESSION: Normal chest. Electronically Signed   By: Paulina Fusi M.D.   On: 05/04/2020 13:59    Procedures Procedures   Medications Ordered in  ED Medications  alum & mag hydroxide-simeth (MAALOX/MYLANTA) 200-200-20 MG/5ML suspension 30 mL (30 mLs Oral Given 05/04/20 1454)    And  lidocaine (XYLOCAINE) 2 % viscous mouth solution 15 mL (15 mLs Oral Given 05/04/20 1454)  ketorolac (TORADOL) 30 MG/ML injection 30 mg (30 mg Intramuscular Given 05/04/20 1529)    ED Course  I have reviewed the triage vital signs and the nursing notes.  Pertinent labs & imaging results that were available during my care of the patient were reviewed by me and considered in my medical decision making (see chart for details).  MDM Rules/Calculators/A&P                          Pt is a 25 yo male with hx of myopericarditis about 6 months ago who presents with chest and abdominal pain for the past month. He has had negative ED workup most recently on 04/29/20. He has hx of myopericarditis but Jan 2022 cardiologic MRI with resolution. Troponin normal. EKG without significant ST or T wave changes. Not likely ACS. History not consistent with aortic dissection or pneumonia. Pt reports improvement in atypical chest pain with GI cocktail and Toradol. Trial outpatient Pepcid as Omeprazole did not help. Pt to follow up with cardiology. He has PCP appointment in April.   Final Clinical Impression(s) / ED Diagnoses Final diagnoses:  Epigastric pain    Rx / DC Orders ED Discharge Orders         Ordered    famotidine (PEPCID) 20 MG tablet  2 times daily        05/04/20 1601           Katha Cabal, DO 05/04/20 1630    Blane Ohara, MD 05/06/20 1510

## 2020-05-06 ENCOUNTER — Telehealth: Payer: Self-pay | Admitting: Cardiology

## 2020-05-06 DIAGNOSIS — R079 Chest pain, unspecified: Secondary | ICD-10-CM

## 2020-05-06 NOTE — Telephone Encounter (Signed)
Called patient back. Patient stated his chest pain is a little different than what it was in December. Patient stated it is more when he is active, in the center of his chest, and effects his breathing. Patient will come in tomorrow for lab work.

## 2020-05-06 NOTE — Telephone Encounter (Signed)
Pt c/o of Chest Pain: STAT if CP now or developed within 24 hours  1. Are you having CP right now? No   2. Are you experiencing any other symptoms (ex. SOB, nausea, vomiting, sweating)? Overly exhausted and slight SOB  3. How long have you been experiencing CP? Past month  4. Is your CP continuous or coming and going? Coming and going. States it happens more often when he exerts himself and he can feel it in his throat and in the center of his chest.  5. Have you taken Nitroglycerin? no ?   Patient has appt with Dr. Mayford Knife on 03/22 at 10:40am

## 2020-05-06 NOTE — Telephone Encounter (Signed)
Please find out if Richard Richmond is same as before. He had Richard Richmond when I saw him in Dec with normal cardiac MRI at that time.  Please have him come in for ESR and CRP and if normal he needs to see PCP and not me

## 2020-05-06 NOTE — Telephone Encounter (Signed)
Patient stated he has no chest pain at this time, but feels extremely tired. Patient went to the ED on 3/15 for chest pain. Patient's symptoms seem to be more GI related per ED note. Patient has follow up with Dr. Mayford Knife next week. Will send message to Dr. Mayford Knife and her nurse.

## 2020-05-07 ENCOUNTER — Other Ambulatory Visit: Payer: Self-pay

## 2020-05-07 ENCOUNTER — Other Ambulatory Visit: Payer: Self-pay | Admitting: *Deleted

## 2020-05-07 DIAGNOSIS — R079 Chest pain, unspecified: Secondary | ICD-10-CM

## 2020-05-08 LAB — C-REACTIVE PROTEIN: CRP: <1 mg/L (ref 0–10)

## 2020-05-08 LAB — SEDIMENTATION RATE: Sed Rate: 8 mm/hr (ref 0–15)

## 2020-05-11 ENCOUNTER — Ambulatory Visit: Payer: Medicaid Other | Admitting: Cardiology

## 2020-06-07 NOTE — Progress Notes (Signed)
Subjective:    Richard Richmond - 25 y.o. male MRN [id number]  Date of birth: [birth year]  HPI  Richard Richmond is to establish care. Patient has a PMH significant for myopericarditis, gastroesophageal reflux disease, abnormal liver function test, elevated liver function tests, and thrombocytopenia transient resolved.    Current issues and/or concerns: 1. ARM PAIN: Visit 01/27/2020 at [facility] per MD note: Myopericarditis: -was still having pleuritic sx in Oct and NSAIDS continued for 2 more weeks and continued on once daily colchicine -still having atypical CP in the midsternal area and upper left chest that is sharp and worse at night -inflammatory markers were normal a few months back -I do not think that this is ongoing inflammation but will get a cardiac MRI to check>>I will also have who reads the study verify that the coronary ostia are originating normally -if MRI is normal then will not refill colchicine and PRN followup with me and followup with PCP -I have instructed him not to exercise for another 3 months  06/08/2020: Left arm pain for about 1.5 months mostly during nighttime. Still having intermittent mid-sternal chest pain. No longer taking Famotidine for acid reflux as felt this was not helping much. Concern arm pain is related to possible allergic reaction and requesting referral to specialist. Concern for allergies to peanut butter, wheat, and sugar.     ROS per HPI    Health Maintenance:  Health Maintenance Due  Topic Date Due  . Hepatitis C Screening  Never done    Past Medical History: Patient Active Problem List   Diagnosis Date Noted  . Myopericarditis 10/29/2019  . Elevated liver function tests 10/29/2019  . Thrombocytopenia - transient, resolved 10/29/2019    Social History   reports that he has quit smoking. He has never used smokeless tobacco. He reports previous alcohol use. He reports that he does not use drugs.    Family History  family history includes Healthy in his father and mother.   Medications: reviewed and updated   Objective:   Physical Exam BP 109/66 (BP Location: Left Arm, Patient Position: Sitting)   Pulse 79   Ht 5' 6.73" (1.695 m)   Wt 115 lb 6.4 oz (52.3 kg)   SpO2 98%   BMI 18.22 kg/m  Physical Exam Cardiovascular:     Rate and Rhythm: Normal rate and regular rhythm.     Pulses: Normal pulses.     Heart sounds: Normal heart sounds.  Pulmonary:     Effort: Pulmonary effort is normal.     Breath sounds: Normal breath sounds.  Musculoskeletal:     Cervical back: Normal range of motion and neck supple.  Neurological:     General: No focal deficit present.     Mental Status: He is alert and oriented to person, place, and time.  Psychiatric:        Mood and Affect: Mood normal.        Behavior: Behavior normal.       Assessment & Plan:  1. Encounter to establish care: - Patient presents today to establish care.  - Return for annual physical examination, labs, and health maintenance. Arrive fasting meaning having no for at least 8 hours prior to appointment. You may have only water or black coffee. Please take scheduled medications as normal.  2. Left arm pain: - Meloxicam as prescribed.  - History of myopericarditis. Currently not followed by Cardiology.  - Patient concern that left arm pain related  to possible allergies to peanut butter, wheat, and sugar. Per patient request referral to Allergy for further evaluation and management.  - Possible neurological etiology. Referral to Neurology for further evaluation and management.  - Follow-up with primary provider as scheduled.  - meloxicam (MOBIC) 7.5 MG tablet; Take 1 tablet (7.5 mg total) by mouth daily.  Dispense: 30 tablet; Refill: 0 - Ambulatory referral to Allergy - Ambulatory referral to Neurology  3. Gastroesophageal reflux disease, unspecified whether esophagitis present: - Patient no longer taking  Famotidine for acid reflux as felt this was not helping much. Endorses making dietary changes to decrease symptoms.  - Follow-up with primary provider as scheduled.    Patient was given clear instructions to go to Emergency Department or return to medical center if symptoms don't improve, worsen, or new problems develop.The patient verbalized understanding.  I discussed the assessment and treatment plan with the patient. The patient was provided an opportunity to ask questions and all were answered. The patient agreed with the plan and demonstrated an understanding of the instructions.   The patient was advised to call back or seek an in-person evaluation if the symptoms worsen or if the condition fails to improve as anticipated.    [name], NP 06/09/2020, 8:56 PM Primary Care at [facility]

## 2020-06-08 ENCOUNTER — Other Ambulatory Visit: Payer: Self-pay

## 2020-06-08 ENCOUNTER — Ambulatory Visit (INDEPENDENT_AMBULATORY_CARE_PROVIDER_SITE_OTHER): Payer: Self-pay | Admitting: Family

## 2020-06-08 ENCOUNTER — Encounter: Payer: Self-pay | Admitting: Family

## 2020-06-08 VITALS — BP 109/66 | HR 79 | Ht 66.73 in | Wt 115.4 lb

## 2020-06-08 DIAGNOSIS — M79602 Pain in left arm: Secondary | ICD-10-CM

## 2020-06-08 DIAGNOSIS — K219 Gastro-esophageal reflux disease without esophagitis: Secondary | ICD-10-CM

## 2020-06-08 DIAGNOSIS — Z7689 Persons encountering health services in other specified circumstances: Secondary | ICD-10-CM

## 2020-06-08 MED ORDER — MELOXICAM 7.5 MG PO TABS: 7.5000 mg | ORAL_TABLET | Freq: Every day | ORAL | 0 refills | Status: DC

## 2020-06-08 NOTE — Patient Instructions (Signed)
- Return for annual physical examination, labs, and health maintenance. Arrive fasting meaning having no for at least 8 hours prior to appointment. You may have only water or black coffee. Please take scheduled medications as normal. Thank you for choosing [facility] at [facility] for your medical home!    Richard Richmond was seen by Richard Fendt, NP today.   Richard Richmond's primary care provider is Richard Captain Jodi Geralds, NP.   For the best care possible,  you should try to see Richard Stabs, NP whenever you come to clinic.   We look forward to seeing you again soon!  If you have any questions about your visit today,  please call us at [phone]  Or feel free to reach your provider via MyChart.    DASH Eating Plan DASH stands for Dietary Approaches to Stop Hypertension. The DASH eating plan is a healthy eating plan that has been shown to:  Reduce high blood pressure (hypertension).  Reduce your risk for type 2 diabetes, heart disease, and stroke.  Help with weight loss. What are tips for following this plan? Reading food labels  Check food labels for the amount of salt (sodium) per serving. Choose foods with less than 5 percent of the Daily Value of sodium. Generally, foods with less than 300 milligrams (mg) of sodium per serving fit into this eating plan.  To find whole grains, look for the word "whole" as the first word in the ingredient list. Shopping  Buy products labeled as "low-sodium" or "no salt added."  Buy fresh foods. Avoid canned foods and pre-made or frozen meals. Cooking  Avoid adding salt when cooking. Use salt-free seasonings or herbs instead of table salt or sea salt. Check with your health care provider or pharmacist before using salt substitutes.  Do not fry foods. Cook foods using healthy methods such as baking, boiling, grilling, roasting, and broiling instead.  Cook with heart-healthy oils, such as olive, canola, avocado, soybean, or sunflower  oil. Meal planning  Eat a balanced diet that includes: ? 4 or more servings of fruits and 4 or more servings of vegetables each day. Try to fill one-half of your plate with fruits and vegetables. ? 6-8 servings of whole grains each day. ? Less than 6 oz (170 g) of lean meat, poultry, or fish each day. A 3-oz (85-g) serving of meat is about the same size as a deck of cards. One egg equals 1 oz (28 g). ? 2-3 servings of low-fat dairy each day. One serving is 1 cup (237 mL). ? 1 serving of nuts, seeds, or beans 5 times each week. ? 2-3 servings of heart-healthy fats. Healthy fats called omega-3 fatty acids are found in foods such as walnuts, flaxseeds, fortified milks, and eggs. These fats are also found in cold-water fish, such as sardines, salmon, and mackerel.  Limit how much you eat of: ? Canned or prepackaged foods. ? Food that is high in trans fat, such as some fried foods. ? Food that is high in saturated fat, such as fatty meat. ? Desserts and other sweets, sugary drinks, and other foods with added sugar. ? Full-fat dairy products.  Do not salt foods before eating.  Do not eat more than 4 egg yolks a week.  Try to eat at least 2 vegetarian meals a week.  Eat more home-cooked food and less restaurant, buffet, and fast food.   Lifestyle  When eating at a restaurant, ask that your food be prepared  with less salt or no salt, if possible.  If you drink alcohol: ? Limit how much you use to:  0-1 drink a day for women who are not pregnant.  0-2 drinks a day for men. ? Be aware of how much alcohol is in your drink. In the [place] one drink equals one 12 oz bottle of beer (355 mL), one 5 oz glass of wine (148 mL), or one 1 oz glass of hard liquor (44 mL). General information  Avoid eating more than 2,300 mg of salt a day. If you have hypertension, you may need to reduce your sodium intake to 1,500 mg a day.  Work with your health care provider to maintain a healthy body weight or  to lose weight. Ask what an ideal weight is for you.  Get at least 30 minutes of exercise that causes your heart to beat faster (aerobic exercise) most days of the week. Activities may include walking, swimming, or biking.  Work with your health care provider or dietitian to adjust your eating plan to your individual calorie needs. What foods should I eat? Fruits All fresh, dried, or frozen fruit. Canned fruit in natural juice (without added sugar). Vegetables Fresh or frozen vegetables (raw, steamed, roasted, or grilled). Low-sodium or reduced-sodium tomato and vegetable juice. Low-sodium or reduced-sodium tomato sauce and tomato paste. Low-sodium or reduced-sodium canned vegetables. Grains Whole-grain or whole-wheat bread. Whole-grain or whole-wheat pasta. Brown rice. [place]. Bulgur. Whole-grain and low-sodium cereals. Pita bread. Low-fat, low-sodium crackers. Whole-wheat flour tortillas. Meats and other proteins Skinless chicken or [place]. Ground chicken or [place]. Pork with fat trimmed off. Fish and seafood. Egg whites. Dried beans, peas, or lentils. Unsalted nuts, nut butters, and seeds. Unsalted canned beans. Lean cuts of beef with fat trimmed off. Low-sodium, lean precooked or cured meat, such as sausages or meat loaves. Dairy Low-fat (1%) or fat-free (skim) milk. Reduced-fat, low-fat, or fat-free cheeses. Nonfat, low-sodium ricotta or cottage cheese. Low-fat or nonfat yogurt. Low-fat, low-sodium cheese. Fats and oils Soft margarine without trans fats. Vegetable oil. Reduced-fat, low-fat, or light mayonnaise and salad dressings (reduced-sodium). Canola, safflower, olive, avocado, soybean, and sunflower oils. Avocado. Seasonings and condiments Herbs. Spices. Seasoning mixes without salt. Other foods Unsalted popcorn and pretzels. Fat-free sweets. The items listed above may not be a complete list of foods and beverages you can eat. Contact a dietitian for more information. What  foods should I avoid? Fruits Canned fruit in a light or heavy syrup. Fried fruit. Fruit in cream or butter sauce. Vegetables Creamed or fried vegetables. Vegetables in a cheese sauce. Regular canned vegetables (not low-sodium or reduced-sodium). Regular canned tomato sauce and paste (not low-sodium or reduced-sodium). Regular tomato and vegetable juice (not low-sodium or reduced-sodium). Angie Fava. Olives. Grains Baked goods made with fat, such as croissants, muffins, or some breads. Dry pasta or rice meal packs. Meats and other proteins Fatty cuts of meat. Ribs. Fried meat. Berniece Salines. Bologna, salami, and other precooked or cured meats, such as sausages or meat loaves. Fat from the back of a pig (fatback). Bratwurst. Salted nuts and seeds. Canned beans with added salt. Canned or smoked fish. Whole eggs or egg yolks. Chicken or [place] with skin. Dairy Whole or 2% milk, cream, and half-and-half. Whole or full-fat cream cheese. Whole-fat or sweetened yogurt. Full-fat cheese. Nondairy creamers. Whipped toppings. Processed cheese and cheese spreads. Fats and oils Butter. Stick margarine. Lard. Shortening. Ghee. Bacon fat. Tropical oils, such as coconut, palm kernel, or palm oil. Seasonings and condiments  Onion salt, garlic salt, seasoned salt, table salt, and sea salt. Worcestershire sauce. Tartar sauce. Barbecue sauce. Teriyaki sauce. Soy sauce, including reduced-sodium. Steak sauce. Canned and packaged gravies. Fish sauce. Oyster sauce. Cocktail sauce. Store-bought horseradish. Ketchup. Mustard. Meat flavorings and tenderizers. Bouillon cubes. Hot sauces. Pre-made or packaged marinades. Pre-made or packaged taco seasonings. Relishes. Regular salad dressings. Other foods Salted popcorn and pretzels. The items listed above may not be a complete list of foods and beverages you should avoid. Contact a dietitian for more information. Where to find more information  National Heart, Lung, and Blood Institute:  https://wilson-eaton.com/  American Heart Association: www.heart.org  Academy of Nutrition and Dietetics: www.eatright.[place]: www.kidney.org Summary  The DASH eating plan is a healthy eating plan that has been shown to reduce high blood pressure (hypertension). It may also reduce your risk for type 2 diabetes, heart disease, and stroke.  When on the DASH eating plan, aim to eat more fresh fruits and vegetables, whole grains, lean proteins, low-fat dairy, and heart-healthy fats.  With the DASH eating plan, you should limit salt (sodium) intake to 2,300 mg a day. If you have hypertension, you may need to reduce your sodium intake to 1,500 mg a day.  Work with your health care provider or dietitian to adjust your eating plan to your individual calorie needs. This information is not intended to replace advice given to you by your health care provider. Make sure you discuss any questions you have with your health care provider. Document Revised: 01/10/2019 Document Reviewed: 01/10/2019 Elsevier Patient Education  2021 [employer].

## 2020-06-08 NOTE — Progress Notes (Signed)
Establish care Pt experiencing left arm pain and upper back pain on left side for 1 month now  Has been diagnosed with acid reflux Not taking Pepcid wanted to try change diet  Having focus issues along with headaches with pain around the frontal of skull

## 2020-06-15 ENCOUNTER — Encounter: Payer: Self-pay | Admitting: Neurology

## 2020-07-17 ENCOUNTER — Emergency Department (HOSPITAL_COMMUNITY)
Admission: EM | Admit: 2020-07-17 | Discharge: 2020-07-17 | Disposition: A | Discharge status: Home / Self Care | Payer: 59 | Attending: Emergency Medicine | Admitting: Emergency Medicine

## 2020-07-17 ENCOUNTER — Other Ambulatory Visit: Payer: Self-pay

## 2020-07-17 ENCOUNTER — Emergency Department (HOSPITAL_COMMUNITY): Payer: 59

## 2020-07-17 DIAGNOSIS — R0789 Other chest pain: Secondary | ICD-10-CM | POA: Insufficient documentation

## 2020-07-17 DIAGNOSIS — Z87891 Personal history of nicotine dependence: Secondary | ICD-10-CM | POA: Diagnosis not present

## 2020-07-17 DIAGNOSIS — K219 Gastro-esophageal reflux disease without esophagitis: Secondary | ICD-10-CM | POA: Diagnosis not present

## 2020-07-17 LAB — CBC WITH DIFFERENTIAL/PLATELET
Abs Immature Granulocytes: 0.01 10*3/uL (ref 0.00–0.07)
Basophils Absolute: 0 10*3/uL (ref 0.0–0.1)
Basophils Relative: 1 %
Eosinophils Absolute: 0.1 10*3/uL (ref 0.0–0.5)
Eosinophils Relative: 2 %
HCT: 46.5 % (ref 39.0–52.0)
Hemoglobin: 15.5 g/dL (ref 13.0–17.0)
Immature Granulocytes: 0 %
Lymphocytes Relative: 30 %
Lymphs Abs: 1.2 10*3/uL (ref 0.7–4.0)
MCH: 28.1 pg (ref 26.0–34.0)
MCHC: 33.3 g/dL (ref 30.0–36.0)
MCV: 84.2 fL (ref 80.0–100.0)
Monocytes Absolute: 0.4 10*3/uL (ref 0.1–1.0)
Monocytes Relative: 9 %
Neutro Abs: 2.4 10*3/uL (ref 1.7–7.7)
Neutrophils Relative %: 58 %
Platelets: 300 10*3/uL (ref 150–400)
RBC: 5.52 MIL/uL (ref 4.22–5.81)
RDW: 12 % (ref 11.5–15.5)
WBC: 4.1 10*3/uL (ref 4.0–10.5)
nRBC: 0 % (ref 0.0–0.2)

## 2020-07-17 LAB — COMPREHENSIVE METABOLIC PANEL
ALT: 24 U/L (ref 0–44)
AST: 23 U/L (ref 15–41)
Albumin: 4.8 g/dL (ref 3.5–5.0)
Alkaline Phosphatase: 67 U/L (ref 38–126)
Anion gap: 9 (ref 5–15)
BUN: 17 mg/dL (ref 6–20)
CO2: 28 mmol/L (ref 22–32)
Calcium: 10.1 mg/dL (ref 8.9–10.3)
Chloride: 102 mmol/L (ref 98–111)
Creatinine, Ser: 0.83 mg/dL (ref 0.61–1.24)
GFR, Estimated: >60 mL/min (ref >60)
Glucose, Bld: 91 mg/dL (ref 70–99)
Potassium: 4.2 mmol/L (ref 3.5–5.1)
Sodium: 139 mmol/L (ref 135–145)
Total Bilirubin: 0.3 mg/dL (ref 0.3–1.2)
Total Protein: 8.2 g/dL — ABNORMAL HIGH (ref 6.5–8.1)

## 2020-07-17 LAB — TROPONIN I (HIGH SENSITIVITY)
Troponin I (High Sensitivity): <2 ng/L (ref <18)
Troponin I (High Sensitivity): <2 ng/L (ref <18)

## 2020-07-17 LAB — LIPASE, BLOOD: Lipase: 26 U/L (ref 11–51)

## 2020-07-17 IMAGING — CR DG CHEST 2V
2 series · 2 of 2 positions shown · non-contrast
Comparison: [DATE]

CLINICAL DATA: Chest pain.

EXAM:
CHEST - 2 VIEW

[w chest pa]
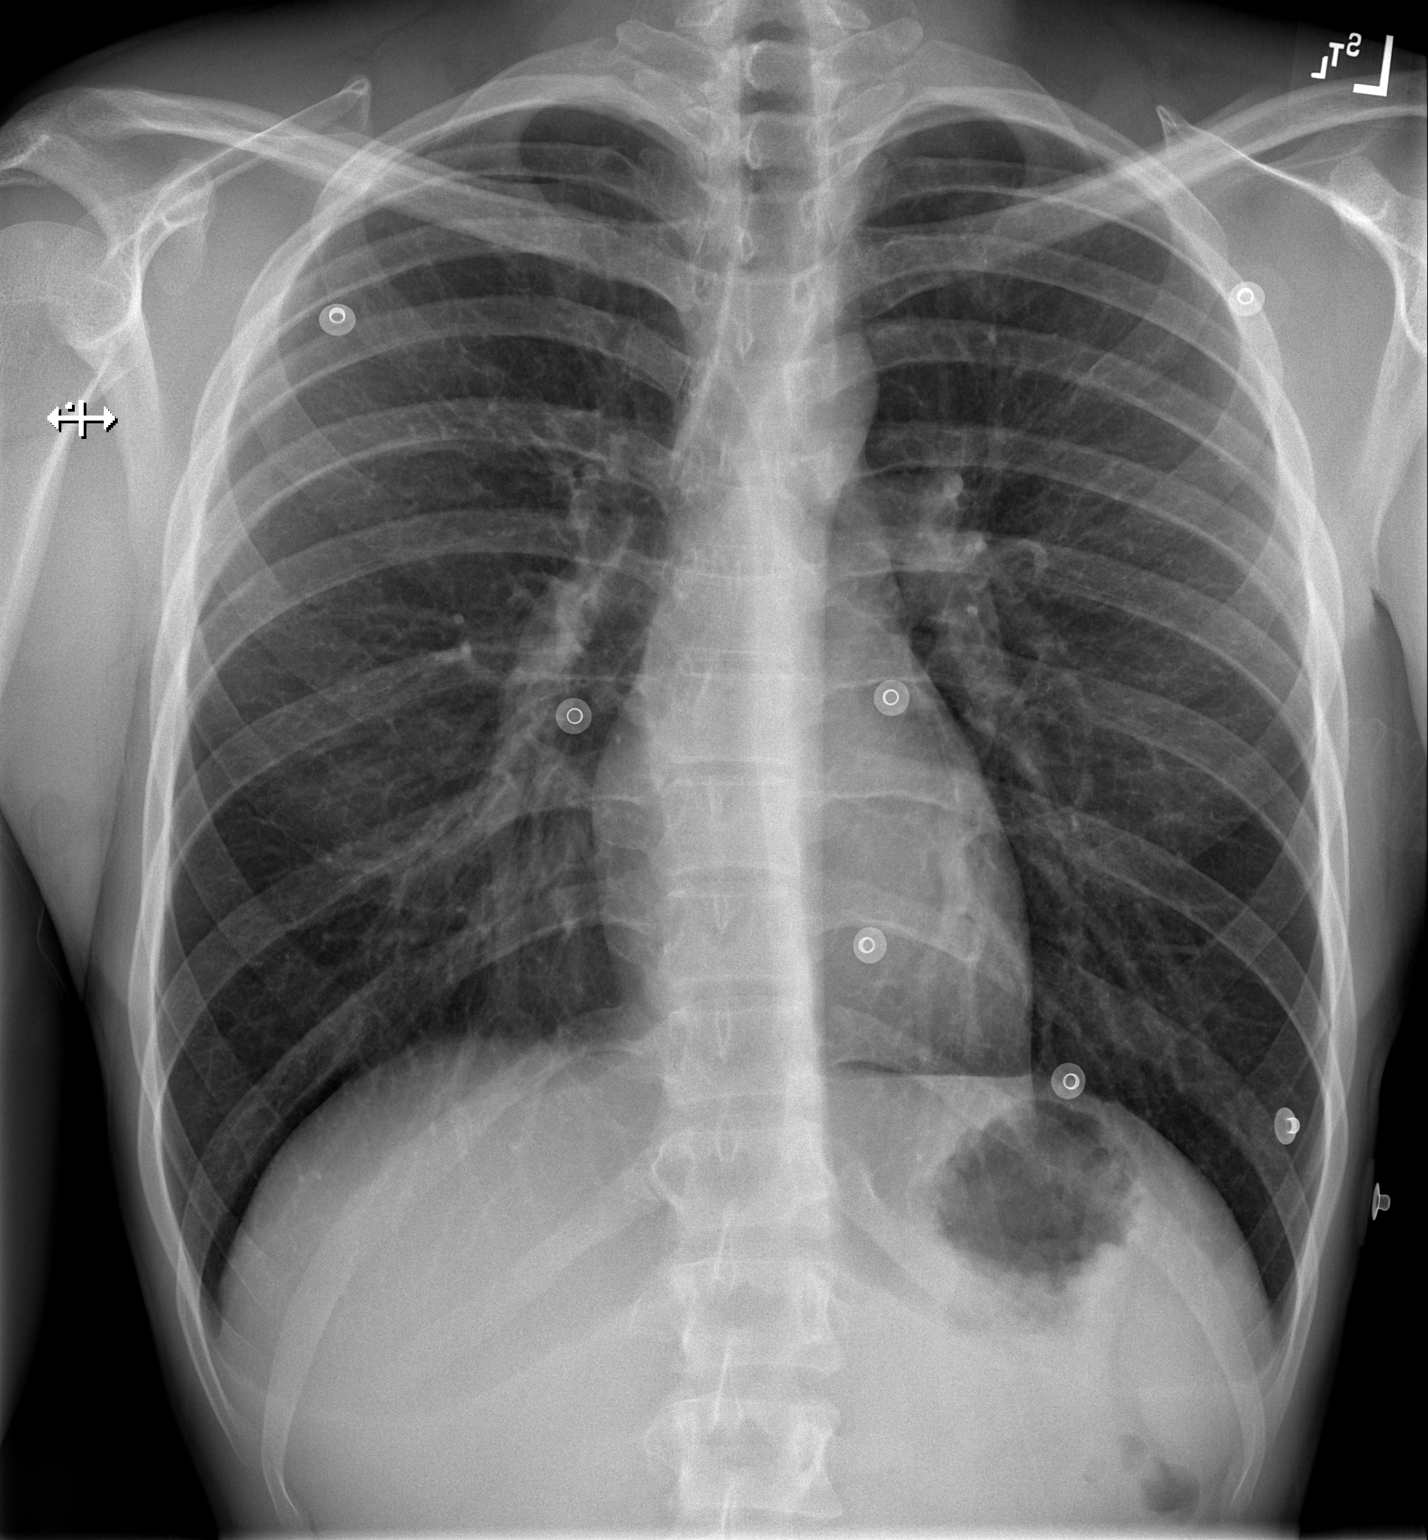

[w chest lat]
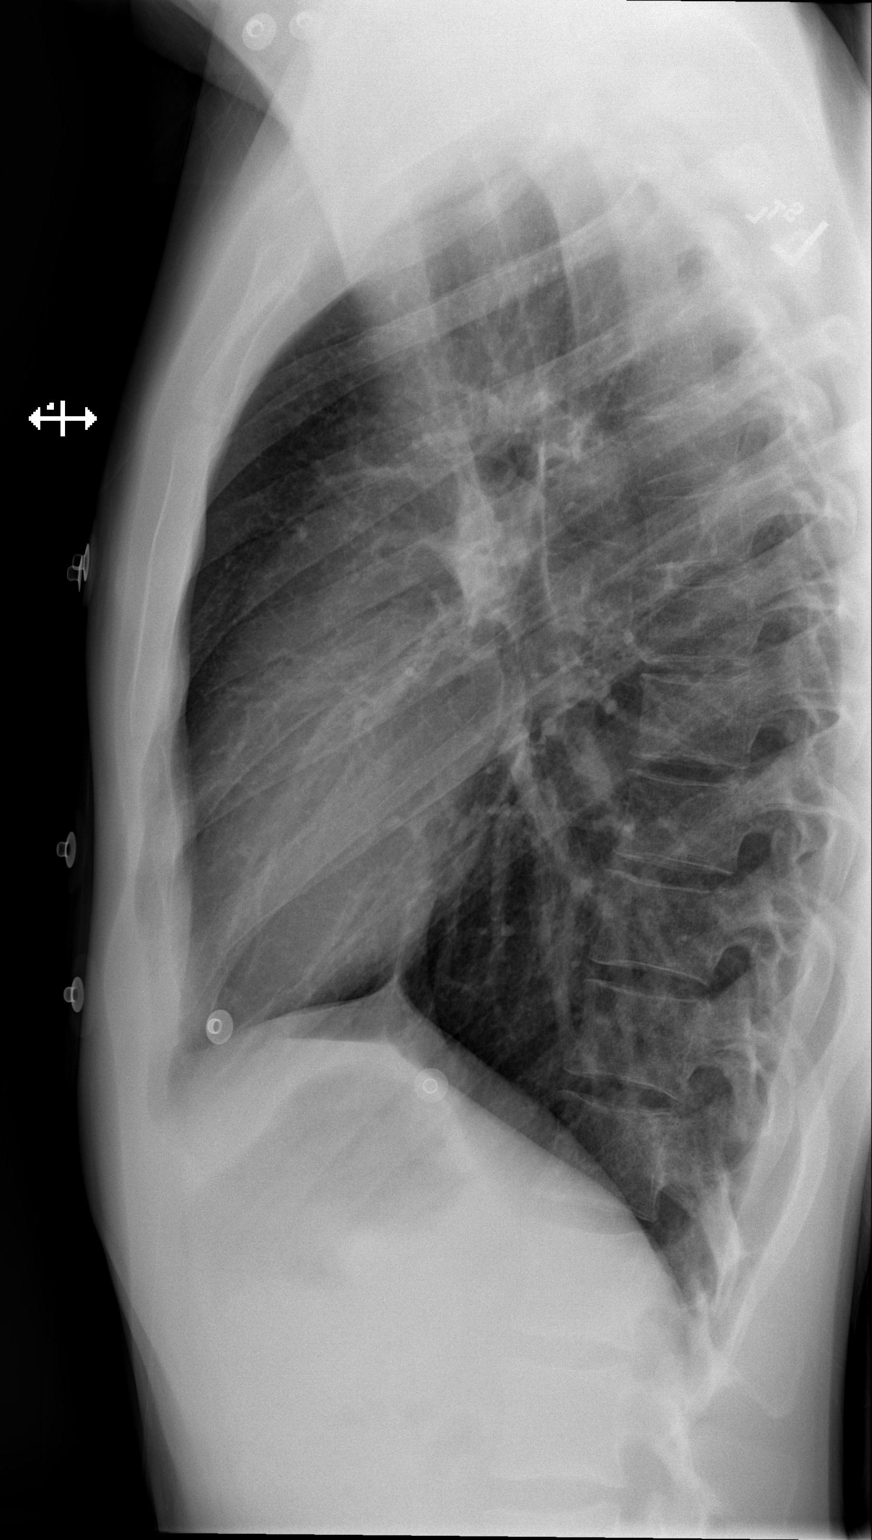

[2 of 2 positions shown; findings below may reference images not displayed]

FINDINGS: Cardiomediastinal silhouette is normal. Mediastinal contours appear
intact.

There is no evidence of focal airspace consolidation, pleural
effusion or pneumothorax.

Osseous structures are without acute abnormality. Soft tissues are
grossly normal.
IMPRESSION: No active cardiopulmonary disease.

## 2020-07-17 MED ORDER — PANTOPRAZOLE SODIUM 20 MG PO TBEC: 20.0000 mg | DELAYED_RELEASE_TABLET | Freq: Every day | ORAL | 0 refills | Status: DC

## 2020-07-17 MED ORDER — ALUM & MAG HYDROXIDE-SIMETH 200-200-20 MG/5ML PO SUSP
30.0000 mL | Freq: Once | ORAL | Status: AC
  Administered 2020-07-17: 30 mL via ORAL
  Filled 2020-07-17: qty 30

## 2020-07-17 NOTE — ED Triage Notes (Signed)
Patient reports severe chest pain around 1030 am going from chest to back. Denies chest pain in triage. Say he has had rib pain and abdominal pain. Feels like he may have arthritis.

## 2020-07-17 NOTE — ED Provider Notes (Signed)
[facility]-EMERGENCY DEPT Provider Note   CSN: [id number] Arrival date & time: 07/17/20  1154     History Chief Complaint  Patient presents with  . Chest Pain    Richard Richmond is a 25 y.o. male.  HPI   25 y/o male with a h/o myopericarditis who presents to the ED today for eval of chest pain that started this morning. Pain felt sharp and was located to the left side of the chest. States that the pain was severe but did not last very long. The sharp pain lasted about 1-2 minutes and gradually got better over time. At present he has no pain. Denies persistent pleuritic pain.   Pt had some diaphoresis, but denies any shortness of breath, nausea, vomiting.  Denies leg pain/swelling, hemoptysis, recent surgery/trauma, recent long travel, hormone use, personal hx of cancer, or hx of DVT/PE.    He further states that he has had some acid reflux lately. For the last several months he has had increased belching, burning in the chest/chest pain, and a sharp pinching feeling in the stomach. Currently he has not been taking any medication for this.   Past Medical History:  Diagnosis Date  . Abnormal liver function test    in context of myopericarditis  . GERD (gastroesophageal reflux disease)    Phreesia 06/05/2020  . Myopericarditis     Patient Active Problem List   Diagnosis Date Noted  . Myopericarditis 10/29/2019  . Elevated liver function tests 10/29/2019  . Thrombocytopenia - transient, resolved 10/29/2019    Past Surgical History:  Procedure Laterality Date  . NO PAST SURGERIES         Family History  Problem Relation Age of Onset  . Healthy Mother   . Healthy Father     Social History   Tobacco Use  . Smoking status: Former Games developer  . Smokeless tobacco: Never Used  Vaping Use  . Vaping Use: Never used  Substance Use Topics  . Alcohol use: Not Currently    Comment: Stopped 08/2019  . Drug use: Never    Home Medications Prior to  Admission medications   Medication Sig Start Date End Date Taking? Authorizing Provider  pantoprazole (PROTONIX) 20 MG tablet Take 1 tablet (20 mg total) by mouth daily for 14 days. 07/17/20 07/31/20 Yes Laasia Arcos S, PA-C  albuterol (VENTOLIN HFA) 108 (90 Base) MCG/ACT inhaler Inhale 1-2 puffs into the lungs every 6 (six) hours as needed for wheezing or shortness of breath. 04/16/20   Particia Nearing, PA-C  famotidine (PEPCID) 20 MG tablet Take 1 tablet (20 mg total) by mouth 2 (two) times daily for 14 days. 05/04/20 05/18/20  Katha Cabal, DO  meloxicam (MOBIC) 7.5 MG tablet Take 1 tablet (7.5 mg total) by mouth daily. 06/08/20   Rema Fendt, NP    Allergies    Patient has no known allergies.  Review of Systems   Review of Systems  Physical Exam Updated Vital Signs BP (!) 110/56 (BP Location: Left Arm)   Pulse 81   Temp (!) 97.5 F (36.4 C) (Oral)   Resp 18   Ht 5\' 6"  (1.676 m)   Wt 52.2 kg   SpO2 97%   BMI 18.56 kg/m   Physical Exam  ED Results / Procedures / Treatments   Labs (all labs ordered are listed, but only abnormal results are displayed) Labs Reviewed  COMPREHENSIVE METABOLIC PANEL - Abnormal; Notable for the following components:  Result Value   Total Protein 8.2 (*)    All other components within normal limits  CBC WITH DIFFERENTIAL/PLATELET  LIPASE, BLOOD  TROPONIN I (HIGH SENSITIVITY)  TROPONIN I (HIGH SENSITIVITY)    EKG EKG Interpretation  Date/Time:  Saturday Jul 17 2020 12:50:37 EDT Ventricular Rate:  84 PR Interval:  139 QRS Duration: 79 QT Interval:  347 QTC Calculation: 411 R Axis:   90 Text Interpretation: ST elevation of <1 mm in II, III, V3-V6similar to priormorphology No reciprocal changes QTc normal Confirmed by Pieter Partridge ([id number]) on 07/17/2020 4:15:23 PM   EKG Interpretation  Date/Time:  Saturday Jul 17 2020 16:53:56 EDT Ventricular Rate:  85 PR Interval:  147 QRS Duration: 82 QT Interval:  352 QTC  Calculation: 419 R Axis:   91 Text Interpretation: Sinus rhythm Borderline right axis deviation ST elevation suggests acute pericarditis vs early repolarization, similar to ECG April 29 2020 Confirmed by Linwood Dibbles [id number]) on 07/17/2020 5:02:05 PM        Radiology DG Chest 2 View  Result Date: 07/17/2020 CLINICAL DATA:  Chest pain. EXAM: CHEST - 2 VIEW COMPARISON:  May 04, 2020 FINDINGS: Cardiomediastinal silhouette is normal. Mediastinal contours appear intact. There is no evidence of focal airspace consolidation, pleural effusion or pneumothorax. Osseous structures are without acute abnormality. Soft tissues are grossly normal. IMPRESSION: No active cardiopulmonary disease. Electronically Signed   By: Ted Mcalpine M.D.   On: 07/17/2020 13:14    Procedures Procedures   Medications Ordered in ED Medications  alum & mag hydroxide-simeth (MAALOX/MYLANTA) 200-200-20 MG/5ML suspension 30 mL (30 mLs Oral Given 07/17/20 1644)    ED Course  I have reviewed the triage vital signs and the nursing notes.  Pertinent labs & imaging results that were available during my care of the patient were reviewed by me and considered in my medical decision making (see chart for details).    MDM Rules/Calculators/A&P                          25 y/o m here with with an episode of chest pain that occurred this am  Reviewed/interpreted labs  Cbc, cmp lipase are neg Delta trops are neg  ekgs are unchanged from prior  cxr reviewed/interpreted and was neg for any acute cardiopulmonary abnormality  5:50 PM CONSULT with cards fellow who reviewed the ekgs. He states that it appears to look consistent with early repol. States if there is increased concern for pericarditis then we could tx with colchicine or nsaids however if there is low concern then he would not treat.   Pt with chest pain. Low risk wells. PERC neg. Doubt PE, especially given complete resolution of sxs at the time of my evaluation.  Doubt acs or myocarditis with neg trops. Low suspicion for pericarditis. I suspect that given fleeting nature of sxs and recent exacerbation of acid reflux that has not been treated, this may be more gi related. Will start pt on ppi. Will have him f/u with cards and his pcp for further eval. Have advised him to return to the ed for new or worsening sxs. He voices understanding of the plan and reasons to return. All questions answered, pt stable for discharge.   Case discussed with Dr. Delford Field who is in agreement with the plan   Final Clinical Impression(s) / ED Diagnoses Final diagnoses:  Atypical chest pain    Rx / DC Orders ED Discharge Orders  Ordered    pantoprazole (PROTONIX) 20 MG tablet  Daily        07/17/20 1802           Karrie Meres, PA-C 07/17/20 1802    Koleen Distance, MD 07/17/20 [phone]

## 2020-07-17 NOTE — ED Provider Notes (Signed)
Emergency Medicine Provider Triage Evaluation Note  Richard Richmond , a 25 y.o. male  was evaluated in triage.  Pt complains of chest pain that started this morning upon waking. Denies sob. Hx of myopericarditis.   Review of Systems  Positive: Chest pain Negative: sob  Physical Exam  BP 100/76 (BP Location: Left Arm)   Pulse 89   Temp (!) 97.5 F (36.4 C) (Oral)   Resp 18   SpO2 100%  Gen:   Awake, no distress   Resp:  Normal effort  MSK:   Moves extremities without difficulty  Other:  Heart with rrr, lungs ctab, abd soft and nontender  Medical Decision Making  Medically screening exam initiated at 12:41 PM.  Appropriate orders placed.  AARO MEYERS was informed that the remainder of the evaluation will be completed by another provider, this initial triage assessment does not replace that evaluation, and the importance of remaining in the ED until their evaluation is complete.     Karrie Meres, PA-C 07/17/20 1244    Pricilla Loveless, MD 07/17/20 [phone]

## 2020-07-17 NOTE — Discharge Instructions (Signed)
Take protonix as directed   Please follow up with your primary care provider and your cardiologist within 5-7 days for re-evaluation of your symptoms. If you do not have a primary care provider, information for a healthcare clinic has been provided for you to make arrangements for follow up care. Please return to the emergency department for any new or worsening symptoms.

## 2020-07-20 NOTE — Progress Notes (Signed)
Patient ID: Richard Richmond, male    DOB: [birth year]  MRN: [id number]  CC: Annual Physical Exam   Subjective: Richard Richmond is a 25 y.o. male who presents for annual physical exam.   His concerns today include:  1. HOSPITAL DISCHARGE FOLLOW-UP: Visit 07/17/2020 at [facility] per MD note:  25 y/o m here with with an episode of chest pain that occurred this am  Reviewed/interpreted labs  Cbc, cmp lipase are neg Delta trops are neg  ekgs are unchanged from prior  cxr reviewed/interpreted and was neg for any acute cardiopulmonary abnormality  5:50 PM CONSULT with cards fellow who reviewed the ekgs. He states that it appears to look consistent with early repol. States if there is increased concern for pericarditis then we could tx with colchicine or nsaids however if there is low concern then he would not treat.   Pt with chest pain. Low risk wells. PERC neg. Doubt PE, especially given complete resolution of sxs at the time of my evaluation. Doubt acs or myocarditis with neg trops. Low suspicion for pericarditis. I suspect that given fleeting nature of sxs and recent exacerbation of acid reflux that has not been treated, this may be more gi related. Will start pt on ppi. Will have him f/u with cards and his pcp for further eval. Have advised him to return to the ed for new or worsening sxs. He voices understanding of the plan and reasons to return. All questions answered, pt stable for discharge.   Case discussed with Dr. Delford Field who is in agreement with the plan   07/21/2020: Reports still having acid reflux problems. Worse after eating. Endorses abdomen pain and occasional diarrhea. Has not picked up acid reflux medication yet but plans to soon. He has an appointment scheduled with Gastroenterology on 08/26/2020.  2. LEFT ARM PAIN FOLLOW-UP: Reports he was doing well on Meloxicam until he ran out. Requesting refills.   Patient Active Problem List    Diagnosis Date Noted  . Myopericarditis 10/29/2019  . Elevated liver function tests 10/29/2019  . Thrombocytopenia - transient, resolved 10/29/2019     Current Outpatient Medications on File Prior to Visit  Medication Sig Dispense Refill  . albuterol (VENTOLIN HFA) 108 (90 Base) MCG/ACT inhaler Inhale 1-2 puffs into the lungs every 6 (six) hours as needed for wheezing or shortness of breath. 18 g 0  . pantoprazole (PROTONIX) 20 MG tablet Take 1 tablet (20 mg total) by mouth daily for 14 days. 14 tablet 0  . famotidine (PEPCID) 20 MG tablet Take 1 tablet (20 mg total) by mouth 2 (two) times daily for 14 days. 28 tablet 0   No current facility-administered medications on file prior to visit.    No Known Allergies  Social History   Socioeconomic History  . Marital status: Significant Other    Spouse name: Not on file  . Number of children: Not on file  . Years of education: Not on file  . Highest education level: Not on file  Occupational History  . Not on file  Tobacco Use  . Smoking status: Former Games developer  . Smokeless tobacco: Never Used  Vaping Use  . Vaping Use: Never used  Substance and Sexual Activity  . Alcohol use: Not Currently    Comment: Stopped 08/2019  . Drug use: Never  . Sexual activity: Yes    Birth control/protection: Condom  Other Topics Concern  . Not on file  Social History Narrative  .  Not on file   Social Determinants of Health   Financial Resource Strain: Not on file  Food Insecurity: Not on file  Transportation Needs: Not on file  Physical Activity: Not on file  Stress: Not on file  Social Connections: Not on file  Intimate Partner Violence: Not on file    Family History  Problem Relation Age of Onset  . Healthy Mother   . Healthy Father     Past Surgical History:  Procedure Laterality Date  . NO PAST SURGERIES      ROS: Review of Systems Negative except as stated above  PHYSICAL EXAM: BP 99/63 (BP Location: Left Arm,  Patient Position: Sitting, Cuff Size: Normal)   Pulse 74   Temp 97.7 F (36.5 C)   Resp 15   Ht 5' 6.73" (1.695 m)   Wt 110 lb 6.4 oz (50.1 kg)   SpO2 98%   BMI 17.43 kg/m   Physical Exam HENT:     Head: Normocephalic and atraumatic.     Right Ear: Tympanic membrane, ear canal and external ear normal.     Left Ear: Tympanic membrane, ear canal and external ear normal.  Eyes:     Extraocular Movements: Extraocular movements intact.     Conjunctiva/sclera: Conjunctivae normal.     Pupils: Pupils are equal, round, and reactive to light.  Cardiovascular:     Rate and Rhythm: Normal rate and regular rhythm.     Pulses: Normal pulses.     Heart sounds: Normal heart sounds.  Pulmonary:     Effort: Pulmonary effort is normal.     Breath sounds: Normal breath sounds.  Abdominal:     General: Abdomen is flat. Bowel sounds are normal.     Palpations: Abdomen is soft.  Genitourinary:    Comments: Patient declined examination.  Musculoskeletal:        General: Normal range of motion.     Cervical back: Normal range of motion.  Skin:    General: Skin is warm and dry.     Capillary Refill: Capillary refill takes less than 2 seconds.  Neurological:     General: No focal deficit present.     Mental Status: He is alert and oriented to person, place, and time.  Psychiatric:        Mood and Affect: Mood normal.        Behavior: Behavior normal.     ASSESSMENT AND PLAN: 1. Annual physical exam: - Counseled on 150 minutes of exercise per week as tolerated, healthy eating (including decreased daily intake of saturated fats, cholesterol, added sugars, sodium), STI prevention, and routine healthcare maintenance.  2. Hospital discharge follow-up: 3. Gastroesophageal reflux disease, unspecified whether esophagitis present: - Patient reports feeling the same since hospital discharge on 07/17/2020.  - Patient reports he has an appointment scheduled with [facility] on 08/26/2020  related to gastroesophageal reflux disease. Patient scheduled that appointment without referral.  - Will go ahead and place a referral to Gastroenterology to assist patient with health insurance billing. He is aware to keep his already scheduled appointment.  - Ambulatory referral to Gastroenterology   4. Screening for metabolic disorder: - CMP last obtained 07/17/2020.  5. Screening for deficiency anemia: - CBC last obtained 07/17/2020.  6. Diabetes mellitus screening: - Hemoglobin A1c to screen for pre-diabetes/diabetes. - Hemoglobin A1c  7. Screening cholesterol level: - Lipid panel to screen for high cholesterol.  - Lipid panel  8. Thyroid disorder screen: - TSH to check thyroid  function.  - TSH  9. Need for hepatitis C screening test: - Patient declined.   10. Left arm pain: - Continue Meloxicam as prescribed.  - Follow-up with primary provider as scheduled.  - meloxicam (MOBIC) 7.5 MG tablet; Take 1 tablet (7.5 mg total) by mouth daily.  Dispense: 60 tablet; Refill: 0  11. Chest pain, unspecified type: 12. Myopericarditis: - Patient stable today in office without cardiorespiratory distress.  - Chest pain intermittent but none today in office. He does have a history of myopericarditis. He has been followed by Cardiology for the same.  - He recently had a visit at the emergency department for chest pain on 07/17/2020. - Referral to Cardiology for further evaluation and management.  - Ambulatory referral to Cardiology    Patient was given the opportunity to ask questions.  Patient verbalized understanding of the plan and was able to repeat key elements of the plan. Patient was given clear instructions to go to Emergency Department or return to medical center if symptoms don't improve, worsen, or new problems develop.The patient verbalized understanding.   Orders Placed This Encounter  Procedures  . Lipid panel  . TSH  . Hemoglobin A1c  . Ambulatory referral to  Gastroenterology  . Ambulatory referral to Cardiology     Requested Prescriptions   Signed Prescriptions Disp Refills  . meloxicam (MOBIC) 7.5 MG tablet 60 tablet 0    Sig: Take 1 tablet (7.5 mg total) by mouth daily.   Follow-up with primary provider as scheduled.   Rema Fendt, NP

## 2020-07-21 ENCOUNTER — Other Ambulatory Visit: Payer: Self-pay

## 2020-07-21 ENCOUNTER — Ambulatory Visit (INDEPENDENT_AMBULATORY_CARE_PROVIDER_SITE_OTHER): Payer: 59 | Admitting: Family

## 2020-07-21 ENCOUNTER — Encounter: Payer: Self-pay | Admitting: Family

## 2020-07-21 VITALS — BP 99/63 | HR 74 | Temp 97.7°F | Resp 15 | Ht 66.73 in | Wt 110.4 lb

## 2020-07-21 DIAGNOSIS — I319 Disease of pericardium, unspecified: Secondary | ICD-10-CM

## 2020-07-21 DIAGNOSIS — Z0000 Encounter for general adult medical examination without abnormal findings: Secondary | ICD-10-CM

## 2020-07-21 DIAGNOSIS — K219 Gastro-esophageal reflux disease without esophagitis: Secondary | ICD-10-CM | POA: Diagnosis not present

## 2020-07-21 DIAGNOSIS — M79602 Pain in left arm: Secondary | ICD-10-CM

## 2020-07-21 DIAGNOSIS — Z1329 Encounter for screening for other suspected endocrine disorder: Secondary | ICD-10-CM

## 2020-07-21 DIAGNOSIS — Z09 Encounter for follow-up examination after completed treatment for conditions other than malignant neoplasm: Secondary | ICD-10-CM

## 2020-07-21 DIAGNOSIS — Z131 Encounter for screening for diabetes mellitus: Secondary | ICD-10-CM

## 2020-07-21 DIAGNOSIS — Z130 Encounter for screening for diseases of the blood and blood-forming organs and certain disorders involving the immune mechanism: Secondary | ICD-10-CM

## 2020-07-21 DIAGNOSIS — Z13220 Encounter for screening for lipoid disorders: Secondary | ICD-10-CM

## 2020-07-21 DIAGNOSIS — R079 Chest pain, unspecified: Secondary | ICD-10-CM

## 2020-07-21 DIAGNOSIS — Z13228 Encounter for screening for other metabolic disorders: Secondary | ICD-10-CM | POA: Diagnosis not present

## 2020-07-21 DIAGNOSIS — Z1159 Encounter for screening for other viral diseases: Secondary | ICD-10-CM

## 2020-07-21 MED ORDER — MELOXICAM 7.5 MG PO TABS: 7.5000 mg | ORAL_TABLET | Freq: Every day | ORAL | 0 refills | Status: AC

## 2020-07-21 NOTE — Patient Instructions (Signed)
Preventive Care 21-26 Years Old, Male Preventive care refers to lifestyle choices and visits with your health care provider that can promote health and wellness. This includes:  A yearly physical exam. This is also called an annual wellness visit.  Regular dental and eye exams.  Immunizations.  Screening for certain conditions.  Healthy lifestyle choices, such as: ? Eating a healthy diet. ? Getting regular exercise. ? Not using drugs or products that contain nicotine and tobacco. ? Limiting alcohol use. What can I expect for my preventive care visit? Physical exam Your health care provider may check your:  Height and weight. These may be used to calculate your BMI (body mass index). BMI is a measurement that tells if you are at a healthy weight.  Heart rate and blood pressure.  Body temperature.  Skin for abnormal spots. Counseling Your health care provider may ask you questions about your:  Past medical problems.  Family's medical history.  Alcohol, tobacco, and drug use.  Emotional well-being.  Home life and relationship well-being.  Sexual activity.  Diet, exercise, and sleep habits.  Work and work environment.  Access to firearms. What immunizations do I need? Vaccines are usually given at various ages, according to a schedule. Your health care provider will recommend vaccines for you based on your age, medical history, and lifestyle or other factors, such as travel or where you work.   What tests do I need? Blood tests  Lipid and cholesterol levels. These may be checked every 5 years starting at age 20.  Hepatitis C test.  Hepatitis B test. Screening  Diabetes screening. This is done by checking your blood sugar (glucose) after you have not eaten for a while (fasting).  Genital exam to check for testicular cancer or hernias.  STD (sexually transmitted disease) testing, if you are at risk. Talk with your health care provider about your test results,  treatment options, and if necessary, the need for more tests.   Follow these instructions at home: Eating and drinking  Eat a healthy diet that includes fresh fruits and vegetables, whole grains, lean protein, and low-fat dairy products.  Drink enough fluid to keep your urine pale yellow.  Take vitamin and mineral supplements as recommended by your health care provider.  Do not drink alcohol if your health care provider tells you not to drink.  If you drink alcohol: ? Limit how much you have to 0-2 drinks a day. ? Be aware of how much alcohol is in your drink. In the [place] one drink equals one 12 oz bottle of beer (355 mL), one 5 oz glass of wine (148 mL), or one 1 oz glass of hard liquor (44 mL).   Lifestyle  Take daily care of your teeth and gums. Brush your teeth every morning and night with fluoride toothpaste. Floss one time each day.  Stay active. Exercise for at least 30 minutes 5 or more days each week.  Do not use any products that contain nicotine or tobacco, such as cigarettes, e-cigarettes, and chewing tobacco. If you need help quitting, ask your health care provider.  Do not use drugs.  If you are sexually active, practice safe sex. Use a condom or other form of protection to prevent STIs (sexually transmitted infections).  Find healthy ways to cope with stress, such as: ? Meditation, yoga, or listening to music. ? Journaling. ? Talking to a trusted person. ? Spending time with friends and family. Safety  Always wear your seat belt while driving   or riding in a vehicle.  Do not drive: ? If you have been drinking alcohol. Do not ride with someone who has been drinking. ? When you are tired or distracted. ? While texting.  Wear a helmet and other protective equipment during sports activities.  If you have firearms in your house, make sure you follow all gun safety procedures.  Seek help if you have been physically or sexually abused. What's next?  Go to your  health care provider once a year for an annual wellness visit.  Ask your health care provider how often you should have your eyes and teeth checked.  Stay up to date on all vaccines. This information is not intended to replace advice given to you by your health care provider. Make sure you discuss any questions you have with your health care provider. Document Revised: 10/23/2018 Document Reviewed: 01/31/2018 Elsevier Patient Education  2021 Elsevier Inc.  

## 2020-07-21 NOTE — Progress Notes (Signed)
Annual physical exam Pt experiencing abdomen pain, pinching in stomach and back, occasional diarrhea Mild rib pain Pt states symptoms flare after eating Has appt w/[facility] on 08/26/20 Needs refill on Meloxicam

## 2020-07-22 LAB — LIPID PANEL
Chol/HDL Ratio: 2.8 ratio (ref 0.0–5.0)
Cholesterol, Total: 178 mg/dL (ref 100–199)
HDL: 64 mg/dL (ref >39)
LDL Chol Calc (NIH): 104 mg/dL — ABNORMAL HIGH (ref 0–99)
Triglycerides: 48 mg/dL (ref 0–149)
VLDL Cholesterol Cal: 10 mg/dL (ref 5–40)

## 2020-07-22 LAB — TSH: TSH: 0.77 u[IU]/mL (ref 0.450–4.500)

## 2020-07-22 LAB — HEMOGLOBIN A1C
Est. average glucose Bld gHb Est-mCnc: 105 mg/dL
Hgb A1c MFr Bld: 5.3 % (ref 4.8–5.6)

## 2020-07-22 NOTE — Progress Notes (Signed)
Thyroid function normal.   No diabetes.   Cholesterol mildly higher than expected. High cholesterol may increase risk of heart attack and/or stroke. Consider eating more fruits, vegetables, and lean baked meats such as chicken or fish. Moderate intensity exercise at least 150 minutes as tolerated per week may help as well. Encouraged to have rechecked in 3 to 6 months.

## 2020-07-23 ENCOUNTER — Other Ambulatory Visit: Payer: Self-pay

## 2020-07-23 ENCOUNTER — Other Ambulatory Visit: Payer: 59

## 2020-07-23 DIAGNOSIS — Z1159 Encounter for screening for other viral diseases: Secondary | ICD-10-CM

## 2020-07-24 LAB — HEPATITIS C ANTIBODY: Hep C Virus Ab: <0.1 {s_co_ratio} (ref 0.0–0.9)

## 2020-07-24 NOTE — Progress Notes (Signed)
Hepatitis C negative

## 2020-07-29 ENCOUNTER — Ambulatory Visit (INDEPENDENT_AMBULATORY_CARE_PROVIDER_SITE_OTHER): Payer: 59 | Admitting: Cardiology

## 2020-07-29 ENCOUNTER — Other Ambulatory Visit: Payer: Self-pay

## 2020-07-29 ENCOUNTER — Encounter: Payer: Self-pay | Admitting: Cardiology

## 2020-07-29 VITALS — BP 96/58 | HR 82 | Ht 66.5 in | Wt 112.8 lb

## 2020-07-29 DIAGNOSIS — R7989 Other specified abnormal findings of blood chemistry: Secondary | ICD-10-CM | POA: Diagnosis not present

## 2020-07-29 DIAGNOSIS — R079 Chest pain, unspecified: Secondary | ICD-10-CM | POA: Diagnosis not present

## 2020-07-29 DIAGNOSIS — I319 Disease of pericardium, unspecified: Secondary | ICD-10-CM

## 2020-07-29 LAB — TROPONIN T: Troponin T (Highly Sensitive): <6 ng/L (ref 0–22)

## 2020-07-29 NOTE — Addendum Note (Signed)
Addended by: Theresia Majors on: 07/29/2020 09:28 AM   Modules accepted: Orders

## 2020-07-29 NOTE — Addendum Note (Signed)
Addended by: Armanda Magic R on: 07/29/2020 09:24 AM   Modules accepted: Level of Service

## 2020-07-29 NOTE — Patient Instructions (Signed)
Medication Instructions:  Your physician recommends that you continue on your current medications as directed. Please refer to the Current Medication list given to you today.  *If you need a refill on your cardiac medications before your next appointment, please call your pharmacy*   Lab Work: TODAY: ESR, CRP, & troponin If you have labs (blood work) drawn today and your tests are completely normal, you will receive your results only by: [facility] (if you have MyChart) OR A paper copy in the mail If you have any lab test that is abnormal or we need to change your treatment, we will call you to review the results.   Testing/Procedures: Your physician has requested that you have an exercise tolerance test. For further information please visit [email or web address]. Please also follow instruction sheet, as given.  Follow-Up: At [facility], you and your health needs are our priority.  As part of our continuing mission to provide you with exceptional heart care, we have created designated Provider Care Teams.  These Care Teams include your primary Cardiologist (physician) and Advanced Practice Providers (APPs -  Physician Assistants and Nurse Practitioners) who all work together to provide you with the care you need, when you need it.  Follow up with Dr. Radford Pax as needed based on results of testing.

## 2020-07-29 NOTE — Progress Notes (Addendum)
Cardiology Office Note    Date:  07/29/2020   ID:  Richard Richmond, DOB 05-Oct-1995, MRN [id number]  PCP:  Camillia Herter, NP  Cardiologist:  Dr. Radford Pax Chief Complaint: Myopericarditis  History of Present Illness:   Richard Richmond is a 25 y.o. male with recent dx of myopericarditis. He had 1 Covid vaccine back in July. Due to recurrent chest pain, fever and headache came to ER 10/2019. COVID negative. EKG showed diffuse ST elevation (new from 9/4) felt consistent with pericarditis. CTA was negative for PE. cMRI showed "findings consistent with myopericarditis with trivial to small pericardial effusion. Marked hyper-enhancement of the apical and RV free wall pericardium with small amount of myocardial uptake in the distal septum and apex consistent with myopericarditis Area corresponds to RWMA with mild distal septal and apical hypokinesis. Overall preserved EF 56% with mild distal septal and apical hypokinesis." Echo with normal LVEF. Planned for ibuprofen for 14 days and 3 months of colchicine 0.51m qd due to hepatic dysfunction.   He was seen back in the office by PA and was still having chest discomfort intermittently.  Noticed chest pain especially with while laying down and bending. He was prescribed another 2 weeks of NSAIDs and continued on colchicine. Seen back by LTruitt Merle NP in Oct 2021 and was still having sharp CP that was nonexertional.  He was taking colchicine once daily due to elevated LFTs in the past which had normalized on recheck.  Repeat labs showed a CRP of < 1 and sed rate normal at 9.   At last OV he was still having pain in the midsternal area and sometimes up near left shoulder occurring when going to bed at night that was sharp pain and pressure.  He also would still feel tense from his neck and down his left arm at times.  Cardiac MRI was repeated that showed normal LVF with no gad uptake and not consistent with myocarditis.  He was told to followup with me  PRN.  He is now back today for followup due to continued chest pain that he thinks has gotten worse.  He has different types of chest pain. The one pain is over his sternum and is worse with deep breathing and also when he swallows.  The pain is sharp in nature with no radiation.  He also has constant left arm pain that may resolve in 1 hour but then comes back which has been going on for 3-4 months.  He also says that he has been having severe rib pain over the left side in his arm pit that started 2 months ago and will resolve for a day or 2 and then comes back.  He denies any SOB.  He has also had some dizziness.   Past Medical History:  Diagnosis Date   Abnormal liver function test    in context of myopericarditis   GERD (gastroesophageal reflux disease)    Phreesia 06/05/2020   Myopericarditis     Past Surgical History:  Procedure Laterality Date   NO PAST SURGERIES      Current Medications: Prior to Admission medications   Medication Sig Start Date End Date Taking? Authorizing Provider  colchicine 0.6 MG tablet Take 1 tablet (0.6 mg total) by mouth daily. For 3 months total. 10/30/19   CLelon Perla MD  ibuprofen (ADVIL) 800 MG tablet Take 1 tablet (800 mg total) by mouth every 8 (eight) hours. 10/29/19   CLelon Perla MD  pantoprazole (PROTONIX) 40 MG tablet Take 1 tablet (40 mg total) by mouth daily. While taking ibuprofen. 10/29/19   Lelon Perla, MD    Allergies:   Patient has no known allergies.   Social History   Socioeconomic History   Marital status: Significant Other    Spouse name: Not on file   Number of children: Not on file   Years of education: Not on file   Highest education level: Not on file  Occupational History   Not on file  Tobacco Use   Smoking status: Former    Pack years: 0.00   Smokeless tobacco: Never  Vaping Use   Vaping Use: Never used  Substance and Sexual Activity   Alcohol use: Not Currently    Comment: Stopped 08/2019    Drug use: Never   Sexual activity: Yes    Birth control/protection: Condom  Other Topics Concern   Not on file  Social History Narrative   Not on file   Social Determinants of Health   Financial Resource Strain: Not on file  Food Insecurity: Not on file  Transportation Needs: Not on file  Physical Activity: Not on file  Stress: Not on file  Social Connections: Not on file     Family History:  The patient's family history includes Healthy in his father and mother.   ROS:   Please see the history of present illness.    ROS All other systems reviewed and are negative.   PHYSICAL EXAM:   VS:  BP (!) 96/58   Pulse 82   Ht 5' 6.5" (1.689 m)   Wt 112 lb 12.8 oz (51.2 kg)   SpO2 98%   BMI 17.93 kg/m    GEN: Well nourished, well developed in no acute distress HEENT: Normal NECK: No JVD; No carotid bruits LYMPHATICS: No lymphadenopathy CARDIAC:RRR, no murmurs, rubs, gallops RESPIRATORY:  Clear to auscultation without rales, wheezing or rhonchi  ABDOMEN: Soft, non-tender, non-distended MUSCULOSKELETAL:  No edema; No deformity  SKIN: Warm and dry NEUROLOGIC:  Alert and oriented x 3 PSYCHIATRIC:  Normal affect   Wt Readings from Last 3 Encounters:  07/29/20 112 lb 12.8 oz (51.2 kg)  07/21/20 110 lb 6.4 oz (50.1 kg)  07/17/20 115 lb (52.2 kg)     Studies/Labs Reviewed:   EKG:  EKG is not ordered today.    Recent Labs: 04/29/2020: B Natriuretic Peptide 48.0 07/17/2020: ALT 24; BUN 17; Creatinine, Ser 0.83; Hemoglobin 15.5; Platelets 300; Potassium 4.2; Sodium 139 07/21/2020: TSH 0.770   Lipid Panel    Component Value Date/Time   CHOL 178 07/21/2020 1146   TRIG 48 07/21/2020 1146   HDL 64 07/21/2020 1146   CHOLHDL 2.8 07/21/2020 1146   LDLCALC 104 (H) 07/21/2020 1146    Additional studies/ records that were reviewed today include:   Echocardiogram: 10/28/19  1. Left ventricular ejection fraction, by estimation, is 55 to 60%. The  left ventricle has normal function.    2. Right ventricular systolic function is normal. The right ventricular  size is normal. Tricuspid regurgitation signal is inadequate for assessing  PA pressure.   3. The mitral valve is normal in structure. Trivial mitral valve  regurgitation. No evidence of mitral stenosis.   4. The aortic valve is tricuspid. Aortic valve regurgitation is not  visualized. Mild aortic valve sclerosis is present, with no evidence of  aortic valve stenosis.   5. The inferior vena cava is normal in size with greater than 50%  respiratory  variability, suggesting right atrial pressure of 3 mmHg.    ASSESSMENT & PLAN:    1.  Myopericarditis -was still having pleuritic sx in Oct and NSAIDS continued for 2 more weeks and continued on once daily colchicine -was still having atypical CP in the midsternal area and upper left chest that is sharp and worse at night when I saw him last -we discussed at the last OV that I do not think that this is ongoing inflammation from his heart and a cardiac MRI was repeated which was normal incluing normal coronary ostia takeoff  -he is now having chest pain again but not like with his prior myocarditis and he had this pain when he had the MRI that was normal -I do not think his pain is cardiac related  2.  Transaminitis -LFTs have normalized on last check 11/2019 -I have personally reviewed and interpreted outside labs performed by patient's PCP which showed 24   3.  Chest pain -I do not think that his pain is cardiac related -it all appears to be musculoskeletal by description -recent cardiac MRI done for same pain was normal -cardiac markers of inflammation normal -I have recommended an ETT to rule out ischemia although MRI showed normal coronary ostial takeoffs -repeat hsTrop, CRP and ESR Shared Decision Making/Informed Consent The risks [chest pain, shortness of breath, cardiac arrhythmias, dizziness, blood pressure fluctuations, myocardial infarction,  stroke/transient ischemic attack, and life-threatening complications (estimated to be 1 in 10,000)], benefits (risk stratification, diagnosing coronary artery disease, treatment guidance) and alternatives of an exercise tolerance test were discussed in detail with Mr. Gens and he agrees to proceed.  Medication Adjustments/Labs and Tests Ordered: Current medicines are reviewed at length with the patient today.  Concerns regarding medicines are outlined above.  Medication changes, Labs and Tests ordered today are listed in the Patient Instructions below. There are no Patient Instructions on file for this visit.   Signed, Fransico Him, MD  07/29/2020 9:07 AM    [facility] [address], [place], [place]  [address] Phone: [phone]; Fax: [phone]

## 2020-07-29 NOTE — Addendum Note (Signed)
Addended by: Armanda Magic R on: 07/29/2020 12:37 PM   Modules accepted: Orders

## 2020-07-30 LAB — SEDIMENTATION RATE: Sed Rate: 2 mm/hr (ref 0–15)

## 2020-07-30 LAB — C-REACTIVE PROTEIN: CRP: 4 mg/L (ref 0–10)

## 2020-08-05 ENCOUNTER — Other Ambulatory Visit: Payer: Self-pay

## 2020-08-05 ENCOUNTER — Ambulatory Visit (INDEPENDENT_AMBULATORY_CARE_PROVIDER_SITE_OTHER): Payer: 59

## 2020-08-05 DIAGNOSIS — R079 Chest pain, unspecified: Secondary | ICD-10-CM | POA: Diagnosis not present

## 2020-08-05 DIAGNOSIS — I319 Disease of pericardium, unspecified: Secondary | ICD-10-CM

## 2020-08-05 LAB — EXERCISE TOLERANCE TEST
Estimated workload: 12.5 METS
Exercise duration (min): 10 min
Exercise duration (sec): 31 s
MPHR: 195 {beats}/min
Peak HR: 173 {beats}/min
Percent HR: 88 %
RPE: 15
Rest HR: 86 {beats}/min

## 2020-08-09 ENCOUNTER — Other Ambulatory Visit: Payer: Self-pay

## 2020-08-09 ENCOUNTER — Encounter: Payer: Self-pay | Admitting: Allergy

## 2020-08-09 ENCOUNTER — Ambulatory Visit (INDEPENDENT_AMBULATORY_CARE_PROVIDER_SITE_OTHER): Payer: 59 | Admitting: Allergy

## 2020-08-09 VITALS — BP 100/62 | HR 95 | Temp 98.1°F | Resp 18 | Ht 66.0 in | Wt 112.0 lb

## 2020-08-09 DIAGNOSIS — R12 Heartburn: Secondary | ICD-10-CM | POA: Diagnosis not present

## 2020-08-09 DIAGNOSIS — R198 Other specified symptoms and signs involving the digestive system and abdomen: Secondary | ICD-10-CM | POA: Diagnosis not present

## 2020-08-09 DIAGNOSIS — R21 Rash and other nonspecific skin eruption: Secondary | ICD-10-CM | POA: Diagnosis not present

## 2020-08-09 DIAGNOSIS — T781XXD Other adverse food reactions, not elsewhere classified, subsequent encounter: Secondary | ICD-10-CM | POA: Insufficient documentation

## 2020-08-09 MED ORDER — DESONIDE 0.05 % EX OINT: 1.0000 "application " | TOPICAL_OINTMENT | Freq: Two times a day (BID) | CUTANEOUS | 2 refills | Status: DC | PRN

## 2020-08-09 MED ORDER — FAMOTIDINE 40 MG PO TABS: 40.0000 mg | ORAL_TABLET | Freq: Every day | ORAL | 2 refills | Status: DC

## 2020-08-09 NOTE — Assessment & Plan Note (Signed)
Heartburn, abdominal pain, diarrhea for the last 3 to 4 months.  Sugary foods and greasy/fried foods worsen symptoms.  Tried Protonix for 1 week which worsened his symptoms.  Concerned about food allergies.  Denies any rash, hives, swelling or vomiting.  Discussed with patient at length that skin prick testing and bloodwork (food IgE levels) check for IgE mediated reactions which his clinical presentation does not support.   He would still like to undergo food testing today.  Today's skin testing showed: Borderline positive to milk, tree nuts and shellfish - previously tolerated with no issues.   Keep a food journal with symptoms and foods eaten.  Follow up with GI as scheduled.   Do 1 month trial of avoiding milk, tree nuts and shellfish and see if you notice improvement in symptoms.   If no improvement then reintroduce back into your diet.

## 2020-08-09 NOTE — Patient Instructions (Addendum)
Today's skin testing showed: Borderline positive to milk, tree nuts and shellfish. Results given.   Food: Discussed with patient at length that skin prick testing and bloodwork (food IgE levels) check for IgE mediated reactions which his clinical presentation does not support.  Keep a food journal with symptoms and foods eaten. Do 1 month trial of avoiding milk, tree nuts and shellfish and see if you notice improvement in symptoms.  If no improvement then reintroduce back into your diet.   Heartburn: See below for lifestyle and dietary modifications. Take famotidine 40mg  once a day and see if you notice improvement.   Skin: See below for proper skin care. Use desonide 0.05% ointment twice a day as needed for mild eczema flares - okay to use on the face, neck, groin area. Do not use more than 1 week at a time.  Follow up as needed. Follow up with GI and neurology as scheduled.   Skin care recommendations  Bath time: Always use lukewarm water. AVOID very hot or cold water. Keep bathing time to 5-10 minutes. Do NOT use bubble bath. Use a mild soap and use just enough to wash the dirty areas. Do NOT scrub skin vigorously.  After bathing, pat dry your skin with a towel. Do NOT rub or scrub the skin.  Moisturizers and prescriptions:  ALWAYS apply moisturizers immediately after bathing (within 3 minutes). This helps to lock-in moisture. Use the moisturizer several times a day over the whole body. Good summer moisturizers include: Aveeno, CeraVe, Cetaphil. Good winter moisturizers include: Aquaphor, Vaseline, Cerave, Cetaphil, Eucerin, Vanicream. When using moisturizers along with medications, the moisturizer should be applied about one hour after applying the medication to prevent diluting effect of the medication or moisturize around where you applied the medications. When not using medications, the moisturizer can be continued twice daily as maintenance.  Laundry and clothing: Avoid  laundry products with added color or perfumes. Use unscented hypo-allergenic laundry products such as Tide free, Cheer free & gentle, and All free and clear.  If the skin still seems dry or sensitive, you can try double-rinsing the clothes. Avoid tight or scratchy clothing such as wool. Do not use fabric softeners or dyer sheets.

## 2020-08-09 NOTE — Progress Notes (Signed)
New Patient Note  RE: Richard Richmond MRN: [id number] DOB: 1995/11/07 Date of Office Visit: 08/09/2020  Consult requested by: Rema Fendt, NP Primary care provider: Rema Fendt, NP  Chief Complaint: Office Visit  History of Present Illness: I had the pleasure of seeing Richard Richmond for initial evaluation at the [facility] on 08/09/2020. He is a 25 y.o. male, who is referred here by Rema Fendt, NP for the evaluation of possible food allergies.  Patient has been having issues with acid reflux, abdominal pain, diarrhea, chest pain, arm pain for the past 3-4 months.  He is concerned about food allergies - greasy/fried foods tend to make these symptoms worse. Too much sugary foods tend to cause milder symptoms. Symptoms do not always occur after eating though.   He was on Protonix x 1 week which worsened his symptoms - felt dizzy, fatigue, nausea, worsening pains.   Denies any associated rash, hives, swelling, vomiting.  He does get some itching mainly in the scalp area due to his dry skin.   He was seen by cardiology due to history of myocarditis.  He was also referred to neurology and gastroenterology.   Past work up includes: none.  Dietary History: patient has been eating other foods including limited milk, eggs, peanut, limiting treenuts, sesame, shellfish, fish, limited soy, wheat, meats, fruits and vegetables.   Patient had a case of myopericarditis - with unknown trigger. Negative Covid-19 testing.   06/08/2020 PCP visit: "2. Left arm pain: - Meloxicam as prescribed. - History of myopericarditis. Currently not followed by Cardiology. - Patient concern that left arm pain related to possible allergies to peanut butter, wheat, and sugar. Per patient request referral to Allergy for further evaluation and management.  - Possible neurological etiology. Referral to Neurology for further evaluation and management.  - Follow-up with primary provider  as scheduled.  - meloxicam (MOBIC) 7.5 MG tablet; Take 1 tablet (7.5 mg total) by mouth daily.  Dispense: 30 tablet; Refill: 0 - Ambulatory referral to Allergy - Ambulatory referral to Neurology"  Assessment and Plan: Benyamin is a 25 y.o. male with: Gastrointestinal symptoms Heartburn, abdominal pain, diarrhea for the last 3 to 4 months.  Sugary foods and greasy/fried foods worsen symptoms.  Tried Protonix for 1 week which worsened his symptoms.  Concerned about food allergies.  Denies any rash, hives, swelling or vomiting. Discussed with patient at length that skin prick testing and bloodwork (food IgE levels) check for IgE mediated reactions which his clinical presentation does not support.  He would still like to undergo food testing today. Today's skin testing showed: Borderline positive to milk, tree nuts and shellfish - previously tolerated with no issues.  Keep a food journal with symptoms and foods eaten. Follow up with GI as scheduled.  Do 1 month trial of avoiding milk, tree nuts and shellfish and see if you notice improvement in symptoms.  If no improvement then reintroduce back into your diet.   Heartburn Did not tolerate Protonix.  See below for lifestyle and dietary modifications. Take famotidine 40mg  once a day and see if you notice improvement.   Rash and other nonspecific skin eruption Rash along hairline. See below for proper skin care. Use desonide 0.05% ointment twice a day as needed for mild eczema flares - okay to use on the face, neck, groin area. Do not use more than 1 week at a time.  Return if symptoms worsen or fail to improve.  Follow up with GI and neurology as scheduled.   Meds ordered this encounter  Medications   desonide (DESOWEN) 0.05 % ointment    Sig: Apply 1 application topically 2 (two) times daily as needed (rash).    Dispense:  15 g    Refill:  2   famotidine (PEPCID) 40 MG tablet    Sig: Take 1 tablet (40 mg total) by mouth daily.     Dispense:  30 tablet    Refill:  2    Lab Orders  No laboratory test(s) ordered today    Other allergy screening: Asthma: no Rhino conjunctivitis: yes Some itchy eyes in the spring time. Medication allergy: no Hymenoptera allergy: no Urticaria: no Eczema:no History of recurrent infections suggestive of immunodeficency: no  Diagnostics: Skin Testing: Food allergy panel. Borderline positive to milk, tree nuts and shellfish. Results discussed with patient/family.  Food Adult Perc - 08/09/20 1400     Time Antigen Placed 1432    Allergen Manufacturer Waynette Buttery    Location Back    Number of allergen test 72     Control-buffer 50% Glycerol Negative    Control-Histamine 1 mg/ml 2+    1. Peanut Negative    2. Soybean Negative    3. Wheat Negative    4. Sesame Negative    5. Milk, cow --   +/-   6. Egg White, Chicken Negative    7. Casein Negative    8. Shellfish Mix --   +/-   9. Fish Mix Negative    10. Cashew --   +/-   11. Pecan Food --   +/-   12. Walnut Food --   +/-   13. Almond Negative    14. Hazelnut Negative    15. [place] nut Negative    16. Coconut Negative    17. Pistachio Negative    18. Catfish Negative    19. Bass Negative    20. Trout Negative    21. Tuna Negative    22. Salmon Negative    23. Flounder Negative    24. Codfish Negative    25. Shrimp --   +/-   26. Crab Negative    27. Lobster --   +/-   28. Oyster --   +/-   29. Scallops Negative    30. Barley Negative    31. Oat  Negative    32. Rye  Negative    33. Hops Negative    34. Rice Negative    35. Cottonseed Negative    36. Saccharomyces Cerevisiae  Negative    37. Pork Negative    38. [place] Meat Negative    39. Chicken Meat Negative    40. Beef Negative    41. Lamb Negative    42. Tomato Negative    43. White Potato Negative    44. Sweet Potato Negative    45. Pea, Green/English Negative    46. Navy Bean Negative    47. Mushrooms Negative    48. Avocado Negative    49. Onion  Negative    50. Cabbage Negative    51. Carrots Negative    52. Celery Negative    53. Corn Negative    54. Cucumber Negative    55. Grape (White seedless) Negative    56. Orange  Negative    57. Banana Negative    58. Apple Negative    59. Peach Negative    60. Strawberry Negative  61. Cantaloupe Negative    62. Watermelon Negative    63. Pineapple Negative    64. Chocolate/Cacao bean Negative    65. Karaya Gum Negative    66. Acacia (Arabic Gum) Negative    67. Cinnamon Negative    68. Nutmeg Negative    69. Ginger Negative    70. Garlic Negative    71. Pepper, black Negative    72. Mustard Negative             Past Medical History: Patient Active Problem List   Diagnosis Date Noted   Other adverse food reactions, not elsewhere classified, subsequent encounter 08/09/2020   Rash and other nonspecific skin eruption 08/09/2020   Heartburn 08/09/2020   Gastrointestinal symptoms 08/09/2020   Myopericarditis 10/29/2019   Elevated liver function tests 10/29/2019   Thrombocytopenia - transient, resolved 10/29/2019   Past Medical History:  Diagnosis Date   Abnormal liver function test    in context of myopericarditis   GERD (gastroesophageal reflux disease)    Phreesia 06/05/2020   Myopericarditis    Past Surgical History: Past Surgical History:  Procedure Laterality Date   NO PAST SURGERIES     Medication List:  Current Outpatient Medications  Medication Sig Dispense Refill   desonide (DESOWEN) 0.05 % ointment Apply 1 application topically 2 (two) times daily as needed (rash). 15 g 2   famotidine (PEPCID) 40 MG tablet Take 1 tablet (40 mg total) by mouth daily. 30 tablet 2   meloxicam (MOBIC) 7.5 MG tablet Take 1 tablet (7.5 mg total) by mouth daily. 60 tablet 0   No current facility-administered medications for this visit.   Allergies: No Known Allergies Social History: Social History   Socioeconomic History   Marital status: Significant Other     Spouse name: Not on file   Number of children: Not on file   Years of education: Not on file   Highest education level: Not on file  Occupational History   Not on file  Tobacco Use   Smoking status: Former    Pack years: 0.00   Smokeless tobacco: Never  Vaping Use   Vaping Use: Never used  Substance and Sexual Activity   Alcohol use: Not Currently    Comment: Stopped 08/2019   Drug use: Never   Sexual activity: Yes    Birth control/protection: Condom  Other Topics Concern   Not on file  Social History Narrative   Not on file   Social Determinants of Health   Financial Resource Strain: Not on file  Food Insecurity: Not on file  Transportation Needs: Not on file  Physical Activity: Not on file  Stress: Not on file  Social Connections: Not on file   Lives in an apartment. Smoking: quit in 2021. Occupation: Veterinary surgeoncashier  Environmental HistorySurveyor, minerals: Water Damage/mildew in the house: no Engineer, civil (consulting)Carpet in the family room: yes Carpet in the bedroom: yes Heating:  gas and electric Cooling: central Pet: yes 2 cats  Family History: Family History  Problem Relation Age of Onset   Healthy Mother    Healthy Father    Problem                               Relation Asthma                                   Half Brother  Eczema                                No  Food allergy                          No Allergic rhino conjunctivitis     No  Review of Systems  Constitutional:  Positive for fever and unexpected weight change (weight loss). Negative for appetite change and chills.  HENT:  Negative for congestion and rhinorrhea.   Eyes:  Negative for itching.  Respiratory:  Negative for cough, chest tightness, shortness of breath and wheezing.   Cardiovascular:  Positive for chest pain.  Gastrointestinal:  Positive for abdominal pain, diarrhea and nausea.  Genitourinary:  Negative for difficulty urinating.  Skin:  Negative for rash.  Objective: BP 100/62 (BP Location: Left Arm, Patient  Position: Sitting, Cuff Size: Normal)   Pulse 95   Temp 98.1 F (36.7 C) (Temporal)   Resp 18   Ht 5\' 6"  (1.676 m)   Wt 112 lb (50.8 kg)   SpO2 97%   BMI 18.08 kg/m  Body mass index is 18.08 kg/m. Physical Exam Vitals and nursing note reviewed.  Constitutional:      Appearance: Normal appearance. He is well-developed.  HENT:     Head: Normocephalic and atraumatic.     Right Ear: External ear normal.     Left Ear: External ear normal.     Nose: Nose normal.     Mouth/Throat:     Mouth: Mucous membranes are moist.     Pharynx: Oropharynx is clear.  Eyes:     Conjunctiva/sclera: Conjunctivae normal.  Cardiovascular:     Rate and Rhythm: Normal rate and regular rhythm.     Heart sounds: Normal heart sounds. No murmur heard.   No friction rub. No gallop.  Pulmonary:     Effort: Pulmonary effort is normal.     Breath sounds: Normal breath sounds. No wheezing, rhonchi or rales.  Abdominal:     Palpations: Abdomen is soft.  Musculoskeletal:     Cervical back: Neck supple.  Skin:    General: Skin is warm.     Findings: No rash.  Neurological:     Mental Status: He is alert and oriented to person, place, and time.  Psychiatric:        Behavior: Behavior normal.  The plan was reviewed with the patient/family, and all questions/concerned were addressed.  It was my pleasure to see Marrion today and participate in his care. Please feel free to contact me with any questions or concerns.  Sincerely,  Ethelene Browns, DO Allergy & Immunology  [facility] office: [phone] [facility] office: [phone]

## 2020-08-09 NOTE — Assessment & Plan Note (Signed)
Did not tolerate Protonix.   See below for lifestyle and dietary modifications.  Take famotidine 40mg  once a day and see if you notice improvement.

## 2020-08-09 NOTE — Assessment & Plan Note (Signed)
Rash along hairline. . See below for proper skin care. . Use desonide 0.05% ointment twice a day as needed for mild eczema flares - okay to use on the face, neck, groin area. Do not use more than 1 week at a time.

## 2020-08-20 NOTE — Progress Notes (Signed)
NEUROLOGY CONSULTATION NOTE  ORACIO GALEN MRN: [id number] DOB: November 15, 1995  Referring provider: [name], NP Primary care provider: [name], NP  Reason for consult:  left arm pain  Assessment/Plan:   Left sided pain upper extremity/torso - vague symptoms - evaluate for radiculopathy.    Start gabapentin 100mg  at bedtime, titrating to 300mg  at bedtime NCV-EMG left upper extremity Follow up after testing.   Subjective:  Richard Richmond is a 25 year old right-handed male with myopericarditis and GERD who presents for left arm pain.  History supplemented by referring provider's notes. CXRs from 04/17/2019, 04/25/2020, 04/29/2020, 05/04/2020 and 07/17/2020 personally reviewed.  Shotts from elbow up to center back - left worse than right - in hands generalized pain and creeping pain in forearm.  When wake up in moring feels pain.  And work long days - weakness? - index and thumb - aching pain  For a year and a half, he has had some mild left arm pain.  He was diagnosed with myopericarditis in September 2021.  Since then, he has been experiencing recurrent left upper extremity pain as well as left sided chest and rib pain.  He describes a sharp pain from the left elbow up to the left shoulder and between the shoulder blades.  He also may have a diffuse aching pain of the left hand and "creeping pain" in the forearm.  He also may notices pain in the left side of his neck and trapezius.  He denies weakness, numbness or paresthesias.  He particularly notices it in the morning waking up in bed or after a long day working (he is a 07/19/2020 at October 2021).  He has very mild symptoms in the right upper extremity was well.  He has been to the ED on several occasions.  CTA of chest and multiple CXRs have been normal.  He is followed by cardiology.  Cardiac workup was unremarkable and chest pain not felt to be cardiac-related.       PAST MEDICAL HISTORY: Past Medical History:  Diagnosis Date    Abnormal liver function test    in context of myopericarditis   GERD (gastroesophageal reflux disease)    Phreesia 06/05/2020   Myopericarditis     PAST SURGICAL HISTORY: Past Surgical History:  Procedure Laterality Date   NO PAST SURGERIES      MEDICATIONS: Current Outpatient Medications on File Prior to Visit  Medication Sig Dispense Refill   desonide (DESOWEN) 0.05 % ointment Apply 1 application topically 2 (two) times daily as needed (rash). 15 g 2   famotidine (PEPCID) 40 MG tablet Take 1 tablet (40 mg total) by mouth daily. 30 tablet 2   meloxicam (MOBIC) 7.5 MG tablet Take 1 tablet (7.5 mg total) by mouth daily. 60 tablet 0   No current facility-administered medications on file prior to visit.    ALLERGIES: No Known Allergies  FAMILY HISTORY: Family History  Problem Relation Age of Onset   Healthy Mother    Healthy Father     Objective:  Blood pressure 96/67, pulse 82, weight 113 lb (51.3 kg), SpO2 97 %. General: No acute distress.  Patient appears well-groomed.   Head:  Normocephalic/atraumatic Eyes:  fundi examined but not visualized Neck: supple, no paraspinal tenderness, full range of motion Back: No paraspinal tenderness Heart: regular rate and rhythm Lungs: Clear to auscultation bilaterally. Vascular: No carotid bruits. Neurological Exam: Mental status: alert and oriented to person, place, and time, recent and remote memory intact, fund of  knowledge intact, attention and concentration intact, speech fluent and not dysarthric, language intact. Cranial nerves: CN I: not tested CN II: pupils equal, round and reactive to light, visual fields intact CN III, IV, VI:  full range of motion, no nystagmus, no ptosis CN V: facial sensation intact. CN VII: upper and lower face symmetric CN VIII: hearing intact CN IX, X: gag intact, uvula midline CN XI: sternocleidomastoid and trapezius muscles intact CN XII: tongue midline Bulk & Tone: normal, no  fasciculations. Motor:  muscle strength 5/5 throughout Sensation:  Pinprick, temperature and vibratory sensation intact. Deep Tendon Reflexes:  2+ throughout,  toes downgoing.   Finger to nose testing:  Without dysmetria.   Heel to shin:  Without dysmetria.   Gait:  Normal station and stride.  Romberg negative.    Thank you for allowing me to take part in the care of this patient.  Shon Millet, DO  CC: [name], NP

## 2020-08-24 ENCOUNTER — Other Ambulatory Visit: Payer: Self-pay

## 2020-08-24 ENCOUNTER — Encounter: Payer: Self-pay | Admitting: Neurology

## 2020-08-24 ENCOUNTER — Ambulatory Visit (INDEPENDENT_AMBULATORY_CARE_PROVIDER_SITE_OTHER): Payer: 59 | Admitting: Neurology

## 2020-08-24 VITALS — BP 96/67 | HR 82 | Wt 113.0 lb

## 2020-08-24 DIAGNOSIS — M79602 Pain in left arm: Secondary | ICD-10-CM | POA: Diagnosis not present

## 2020-08-24 MED ORDER — GABAPENTIN 100 MG PO CAPS: ORAL_CAPSULE | ORAL | 0 refills | Status: DC

## 2020-08-24 NOTE — Patient Instructions (Signed)
Will get nerve conduction study of left ar Start gabapentin 100mg  - increase dose as directed Follow up after testing.

## 2020-08-26 ENCOUNTER — Ambulatory Visit (INDEPENDENT_AMBULATORY_CARE_PROVIDER_SITE_OTHER): Payer: 59 | Admitting: Gastroenterology

## 2020-08-26 ENCOUNTER — Other Ambulatory Visit: Payer: 59

## 2020-08-26 ENCOUNTER — Encounter: Payer: Self-pay | Admitting: Gastroenterology

## 2020-08-26 VITALS — BP 110/70 | HR 54 | Ht 66.0 in | Wt 112.0 lb

## 2020-08-26 DIAGNOSIS — K219 Gastro-esophageal reflux disease without esophagitis: Secondary | ICD-10-CM

## 2020-08-26 MED ORDER — OMEPRAZOLE 20 MG PO CPDR: 20.0000 mg | DELAYED_RELEASE_CAPSULE | Freq: Every day | ORAL | 0 refills | Status: AC

## 2020-08-26 NOTE — Patient Instructions (Signed)
If you are age 26 or older, your body mass index should be between 23-30. Your Body mass index is 18.08 kg/m. If this is out of the aforementioned range listed, please consider follow up with your Primary Care Provider.  If you are age 71 or younger, your body mass index should be between 19-25. Your Body mass index is 18.08 kg/m. If this is out of the aformentioned range listed, please consider follow up with your Primary Care Provider.   Your provider has requested that you go to the basement level for lab work before leaving today. Press "B" on the elevator. The lab is located at the first door on the left as you exit the elevator.  Please give [place] a call if Omeprazole is not helping.   The [facility] providers would like to encourage you to use [facility] to communicate with providers for non-urgent requests or questions.  Due to long hold times on the telephone, sending your provider a message by [facility] may be a faster and more efficient way to get a response.  Please allow 48 business hours for a response.  Please remember that this is for non-urgent requests.   It was a pleasure to see you today!  Thank you for trusting me with your gastrointestinal care!     Scott E. Tomasa Rand, MD

## 2020-08-26 NOTE — Progress Notes (Signed)
HPI: [name] is a pleasant 25 year old male with a history of myopericarditis in 2021 who was referred to gastroenterology for further evaluation of epigastric pain and heartburn.  Patient states that he has had the symptoms intermittently for a long time, but for the past few months the symptoms have been more severe and more frequent.  Recently, he has been having daily symptoms of a burning sensation in his upper abdomen and chest.  He also reports sensation of fluid in acid in his throat triggering bouts of nausea and the urge to vomit.  Sometimes he has a sharp stabbing or pinching pain in his upper abdomen.  Patient states that every day he wakes up with a burning sensation.  Times he has symptoms that are worsened after eating but sometimes they occur randomly throughout the day.  He has noticed that overly processed foods greasy foods and spicy foods tend to make his symptoms worse so he avoids these foods.  He does not smoke, does not drink coffee or soda.  He does awaken from sleep sometimes due to heartburn.  He denies any symptoms of dysphagia.  His weight has been stable. He was prescribed Protonix in the past, but this made him symptoms feel worse so he stopped it after about a week.  He has taken Pepcid in the past and this helped somewhat.  He has not picked up the Pepcid that he was prescribed recently.  Tums has also helped somewhat.  No prior history of endoscopy. He was diagnosed with myopericarditis in August 2021 after presenting with severe chest pain.  The etiology of his myopericarditis is unknown to him.    Past Medical History:  Diagnosis Date   Abnormal liver function test    in context of myopericarditis   GERD (gastroesophageal reflux disease)    Phreesia 06/05/2020   Myopericarditis     Past Surgical History:  Procedure Laterality Date   NO PAST SURGERIES      Outpatient Medications Prior to Visit  Medication Sig Dispense Refill   desonide (DESOWEN) 0.05 %  ointment Apply 1 application topically 2 (two) times daily as needed (rash). 15 g 2   famotidine (PEPCID) 40 MG tablet Take 1 tablet (40 mg total) by mouth daily. 30 tablet 2   gabapentin (NEURONTIN) 100 MG capsule 1 capsule at bedtime for 1 week, then 2 capsules at bedtime for 1 week, then 3 capsules at bedtime 90 capsule 0   meloxicam (MOBIC) 7.5 MG tablet Take 1 tablet (7.5 mg total) by mouth daily. 60 tablet 0   No facility-administered medications prior to visit.    Allergies  Allergen Reactions   Milk-Related Compounds     Family History  Problem Relation Age of Onset   Healthy Mother    Healthy Father     Social History   Tobacco Use   Smoking status: Former    Pack years: 0.00   Smokeless tobacco: Never  Vaping Use   Vaping Use: Never used  Substance Use Topics   Alcohol use: Not Currently    Comment: Stopped 08/2019   Drug use: Never    ROS: As per history of present illness, otherwise negative  BP 110/70   Pulse (!) 54   Ht 5\' 6"  (1.676 m)   Wt 112 lb (50.8 kg)   SpO2 98%   BMI 18.08 kg/m  Constitutional: Well-developed and well-nourished. Slight in stature, but not malnourished. No distress. HEENT: Normocephalic and atraumatic. Oropharynx is clear and moist.  No scleral icterus. Cardiovascular: Normal rate, regular rhythm and intact distal pulses. No M/R/G Pulmonary/chest: Effort normal and breath sounds normal. No wheezing, rales or rhonchi. Abdominal: Soft, nontender, nondistended. Bowel sounds active throughout. There are no masses palpable. No hepatosplenomegaly. Extremities: no clubbing, cyanosis, or edema Neurological: Alert and oriented to person place and time. Skin: Skin is warm and dry.  Psychiatric: Normal mood and affect. Behavior is normal.  RELEVANT LABS AND IMAGING: CBC    Component Value Date/Time   WBC 4.1 07/17/2020 1336   RBC 5.52 07/17/2020 1336   HGB 15.5 07/17/2020 1336   HCT 46.5 07/17/2020 1336   PLT 300 07/17/2020 1336    MCV 84.2 07/17/2020 1336   MCH 28.1 07/17/2020 1336   MCHC 33.3 07/17/2020 1336   RDW 12.0 07/17/2020 1336   LYMPHSABS 1.2 07/17/2020 1336   MONOABS 0.4 07/17/2020 1336   EOSABS 0.1 07/17/2020 1336   BASOSABS 0.0 07/17/2020 1336    CMP     Component Value Date/Time   NA 139 07/17/2020 1336   NA 141 12/08/2019 1146   K 4.2 07/17/2020 1336   CL 102 07/17/2020 1336   CO2 28 07/17/2020 1336   GLUCOSE 91 07/17/2020 1336   BUN 17 07/17/2020 1336   BUN 14 12/08/2019 1146   CREATININE 0.83 07/17/2020 1336   CALCIUM 10.1 07/17/2020 1336   PROT 8.2 (H) 07/17/2020 1336   PROT 7.6 12/08/2019 1146   ALBUMIN 4.8 07/17/2020 1336   ALBUMIN 5.0 12/08/2019 1146   AST 23 07/17/2020 1336   ALT 24 07/17/2020 1336   ALKPHOS 67 07/17/2020 1336   BILITOT 0.3 07/17/2020 1336   BILITOT 0.3 12/08/2019 1146   GFRNONAA >60 07/17/2020 1336   GFRAA 141 12/08/2019 1146    ASSESSMENT/PLAN: 25 year old male with history of fairly typical GERD symptoms (heartburn and acid regurgitation).  He has been treated with PPI and H2 RA therapies in the past although without an adequate trial.  He has no concerning red flag features that would warrant an endoscopy at this time.  I recommended an adequate trial of once daily omeprazole 20 mg, and if he did not experience relief from this, then we would perform an upper endoscopy. We discussed the pathophysiology of GERD and the principles of GERD management to include behavioral modifications (avoidance of dietary triggers, tobacco/alcohol/caffeine, avoiding meals within 3 hours of bedtime, elevating head of bed, weight loss if applicable) pharmacotherapy with acid suppressive therapies or antacids, and finally fundoplication/surgical treatment.  -Omeprazole 20 mg p.o. daily for 8 weeks, given 30 to 45 minutes before breakfast - Diagnostic upper endoscopy if inadequate response to omeprazole - Check H. pylori stool antigen given prominent abdominal pain component to  his symptomology.    Cc:No referring provider defined for this encounter.

## 2020-08-29 ENCOUNTER — Encounter: Payer: Self-pay | Admitting: Family

## 2020-08-29 LAB — H. PYLORI ANTIGEN, STOOL: H pylori Ag, Stl: NEGATIVE

## 2020-08-30 ENCOUNTER — Other Ambulatory Visit: Payer: Self-pay | Admitting: Family

## 2020-08-30 DIAGNOSIS — M79602 Pain in left arm: Secondary | ICD-10-CM

## 2020-09-06 ENCOUNTER — Ambulatory Visit (INDEPENDENT_AMBULATORY_CARE_PROVIDER_SITE_OTHER): Payer: 59 | Admitting: Physician Assistant

## 2020-09-06 ENCOUNTER — Ambulatory Visit: Payer: Self-pay

## 2020-09-06 ENCOUNTER — Encounter: Payer: Self-pay | Admitting: Physician Assistant

## 2020-09-06 DIAGNOSIS — M542 Cervicalgia: Secondary | ICD-10-CM | POA: Diagnosis not present

## 2020-09-06 NOTE — Progress Notes (Signed)
Office Visit Note   Patient: Richard Richmond           Date of Birth: May 26, 1995           MRN: [id number] Visit Date: 09/06/2020              Requested by: Richard Fendt, NP [address] [place],  [place] [address] PCP: Richard Fendt, NP   Assessment & Plan: Visit Diagnoses:  1. Neck pain     Plan: We will send him to formal physical therapy for range of motion, exercises, home exercise program and modalities to the neck.  We will see him back in approximately 6 weeks to see what kind of response he had to physical therapy.  Also at that time hopefully his EMG nerve conduction studies may shed some light on what is causing the radicular symptoms down the left arm.  Questions were encouraged and answered at length.  Follow-Up Instructions: Return in about 6 weeks (around 10/18/2020).   Orders:  Orders Placed This Encounter  Procedures   XR Cervical Spine 2 or 3 views   No orders of the defined types were placed in this encounter.     Procedures: No procedures performed   Clinical Data: No additional findings.   Subjective: Chief Complaint  Patient presents with   Neck - Pain   Left Arm - Pain, Numbness    HPI Richard Richmond is a 25 year old male comes in today with left arm pain been ongoing for over a year and a half but it is gotten worse over the last 3 months.  No known injury.  He was seen by neurology and was due to have EMG nerve conduction studies of the upper extremities performed.  He was also placed on Neurontin and Mobic.  He stopped taking the Neurontin due to the fact that he was having some panic attacks.  He is having pain between his shoulder blades.  The pain goes into his left arm and rib cage area.  He does have a history of myopericarditis September 2021 which has been worked up by the cardiology otherwise has a negative cardiology work-up.  He does note some feeling of strain and pain in the left whole hand but no numbness tingling.  He  also has pain down the right arm but to a lesser degree.  Denies any shortness of breath or chest pain outside of the rib pain left chest region.  Review of Systems See HPI otherwise negative  Objective: Vital Signs: There were no vitals taken for this visit.  Physical Exam Constitutional:      Appearance: He is normal weight. He is not ill-appearing or diaphoretic.  Cardiovascular:     Pulses: Normal pulses.  Pulmonary:     Effort: Pulmonary effort is normal.  Neurological:     Mental Status: He is alert and oriented to person, place, and time.  Psychiatric:        Mood and Affect: Mood normal.    Ortho Exam Bilateral hands full sensation light touch throughout.  Full motor bilateral hands throughout.  Radial pulses are 2+ bilaterally equal symmetric.  Negative Tinel's over the median nerve at the wrist bilaterally.  Negative compression test over the median nerve at the wrist bilaterally.  Negative Phalen's bilaterally.  Upper extremities 5/ 5 strength throughout against resistance.  Has full range of motion bilateral shoulders without pain.  Negative impingement testing bilaterally.  Cervical spine full range of motion some  stiffness.  Negative Spurling's.  Tenderness medial border left scapula only. Specialty Comments:  No specialty comments available.  Imaging: XR Cervical Spine 2 or 3 views  Result Date: 09/06/2020 Cervical spine AP lateral views: No acute fractures.  No significant arthropathy.  Disc base overall well-maintained.  Loss of lordotic curvature.    PMFS History: Patient Active Problem List   Diagnosis Date Noted   Other adverse food reactions, not elsewhere classified, subsequent encounter 08/09/2020   Rash and other nonspecific skin eruption 08/09/2020   Heartburn 08/09/2020   Gastrointestinal symptoms 08/09/2020   Myopericarditis 10/29/2019   Elevated liver function tests 10/29/2019   Thrombocytopenia - transient, resolved 10/29/2019   Past Medical  History:  Diagnosis Date   Abnormal liver function test    in context of myopericarditis   GERD (gastroesophageal reflux disease)    Phreesia 06/05/2020   Myopericarditis     Family History  Problem Relation Age of Onset   Healthy Mother    Healthy Father     Past Surgical History:  Procedure Laterality Date   NO PAST SURGERIES     Social History   Occupational History   Not on file  Tobacco Use   Smoking status: Former   Smokeless tobacco: Never  Vaping Use   Vaping Use: Never used  Substance and Sexual Activity   Alcohol use: Not Currently    Comment: Stopped 08/2019   Drug use: Never   Sexual activity: Yes    Birth control/protection: Condom

## 2020-09-07 ENCOUNTER — Emergency Department (HOSPITAL_COMMUNITY): Payer: 59

## 2020-09-07 ENCOUNTER — Encounter (HOSPITAL_COMMUNITY): Payer: Self-pay

## 2020-09-07 ENCOUNTER — Other Ambulatory Visit: Payer: Self-pay

## 2020-09-07 ENCOUNTER — Emergency Department (HOSPITAL_COMMUNITY)
Admission: EM | Admit: 2020-09-07 | Discharge: 2020-09-07 | Disposition: A | Discharge status: Home / Self Care | Payer: 59 | Attending: Emergency Medicine | Admitting: Emergency Medicine

## 2020-09-07 DIAGNOSIS — M79602 Pain in left arm: Secondary | ICD-10-CM | POA: Insufficient documentation

## 2020-09-07 DIAGNOSIS — Z87891 Personal history of nicotine dependence: Secondary | ICD-10-CM | POA: Diagnosis not present

## 2020-09-07 DIAGNOSIS — R1013 Epigastric pain: Secondary | ICD-10-CM | POA: Insufficient documentation

## 2020-09-07 DIAGNOSIS — K219 Gastro-esophageal reflux disease without esophagitis: Secondary | ICD-10-CM | POA: Diagnosis not present

## 2020-09-07 DIAGNOSIS — R079 Chest pain, unspecified: Secondary | ICD-10-CM | POA: Diagnosis present

## 2020-09-07 LAB — COMPREHENSIVE METABOLIC PANEL
ALT: 14 U/L (ref 0–44)
AST: 20 U/L (ref 15–41)
Albumin: 5 g/dL (ref 3.5–5.0)
Alkaline Phosphatase: 68 U/L (ref 38–126)
Anion gap: 11 (ref 5–15)
BUN: 15 mg/dL (ref 6–20)
CO2: 27 mmol/L (ref 22–32)
Calcium: 9.9 mg/dL (ref 8.9–10.3)
Chloride: 101 mmol/L (ref 98–111)
Creatinine, Ser: 0.78 mg/dL (ref 0.61–1.24)
GFR, Estimated: >60 mL/min (ref >60)
Glucose, Bld: 83 mg/dL (ref 70–99)
Potassium: 4 mmol/L (ref 3.5–5.1)
Sodium: 139 mmol/L (ref 135–145)
Total Bilirubin: 0.7 mg/dL (ref 0.3–1.2)
Total Protein: 8.2 g/dL — ABNORMAL HIGH (ref 6.5–8.1)

## 2020-09-07 LAB — CBC WITH DIFFERENTIAL/PLATELET
Abs Immature Granulocytes: 0.01 10*3/uL (ref 0.00–0.07)
Basophils Absolute: 0 10*3/uL (ref 0.0–0.1)
Basophils Relative: 0 %
Eosinophils Absolute: 0.1 10*3/uL (ref 0.0–0.5)
Eosinophils Relative: 1 %
HCT: 43.3 % (ref 39.0–52.0)
Hemoglobin: 14.5 g/dL (ref 13.0–17.0)
Immature Granulocytes: 0 %
Lymphocytes Relative: 23 %
Lymphs Abs: 1.8 10*3/uL (ref 0.7–4.0)
MCH: 27.8 pg (ref 26.0–34.0)
MCHC: 33.5 g/dL (ref 30.0–36.0)
MCV: 83.1 fL (ref 80.0–100.0)
Monocytes Absolute: 0.5 10*3/uL (ref 0.1–1.0)
Monocytes Relative: 6 %
Neutro Abs: 5.3 10*3/uL (ref 1.7–7.7)
Neutrophils Relative %: 70 %
Platelets: 218 10*3/uL (ref 150–400)
RBC: 5.21 MIL/uL (ref 4.22–5.81)
RDW: 11.9 % (ref 11.5–15.5)
WBC: 7.7 10*3/uL (ref 4.0–10.5)
nRBC: 0 % (ref 0.0–0.2)

## 2020-09-07 LAB — LIPASE, BLOOD: Lipase: 27 U/L (ref 11–51)

## 2020-09-07 LAB — TROPONIN I (HIGH SENSITIVITY): Troponin I (High Sensitivity): <2 ng/L (ref <18)

## 2020-09-07 IMAGING — CR DG CHEST 2V
2 series · 2 of 2 positions shown · non-contrast
Comparison: [DATE].

CLINICAL DATA: Chest pain.

EXAM:
CHEST - 2 VIEW

[w chest pa]
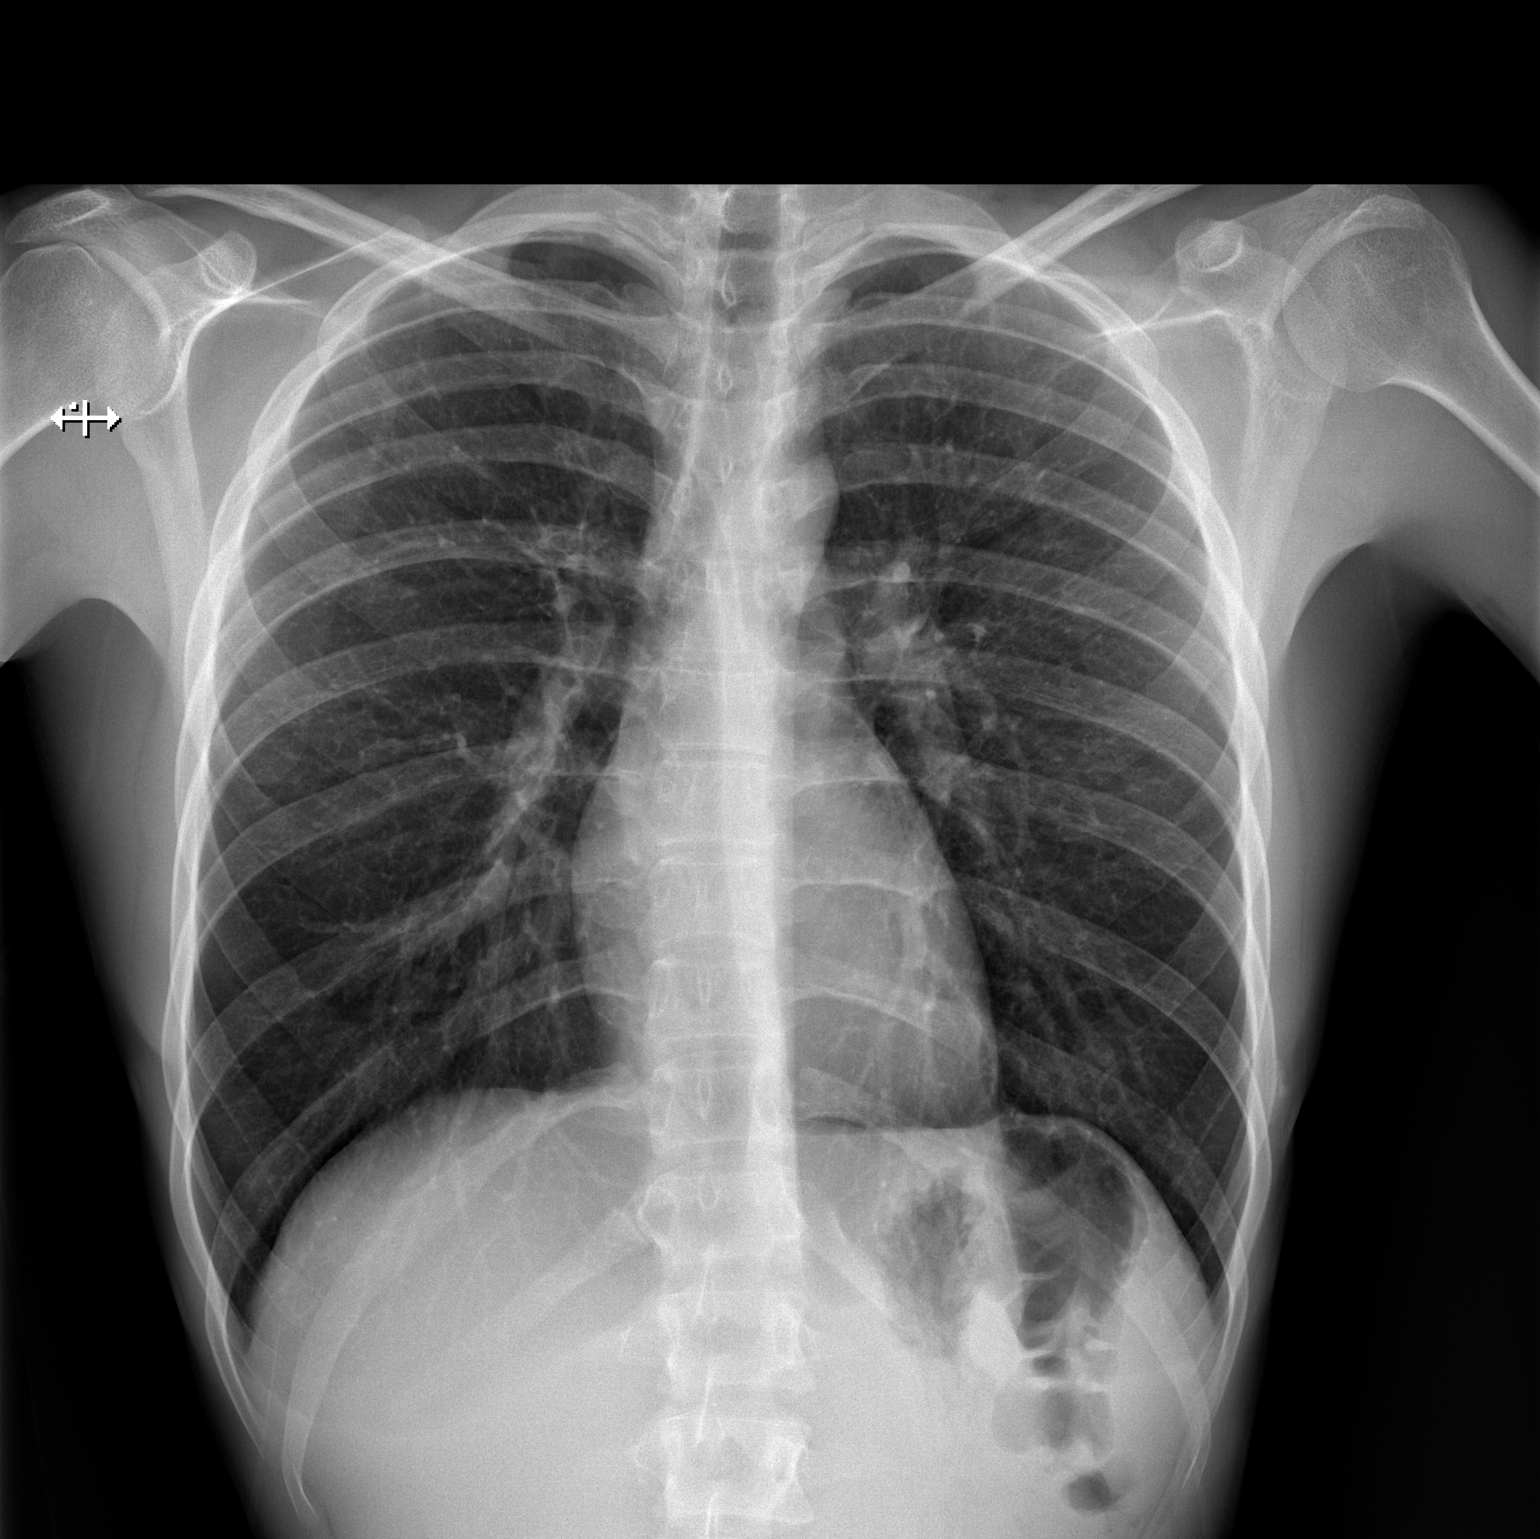

[w chest lat]
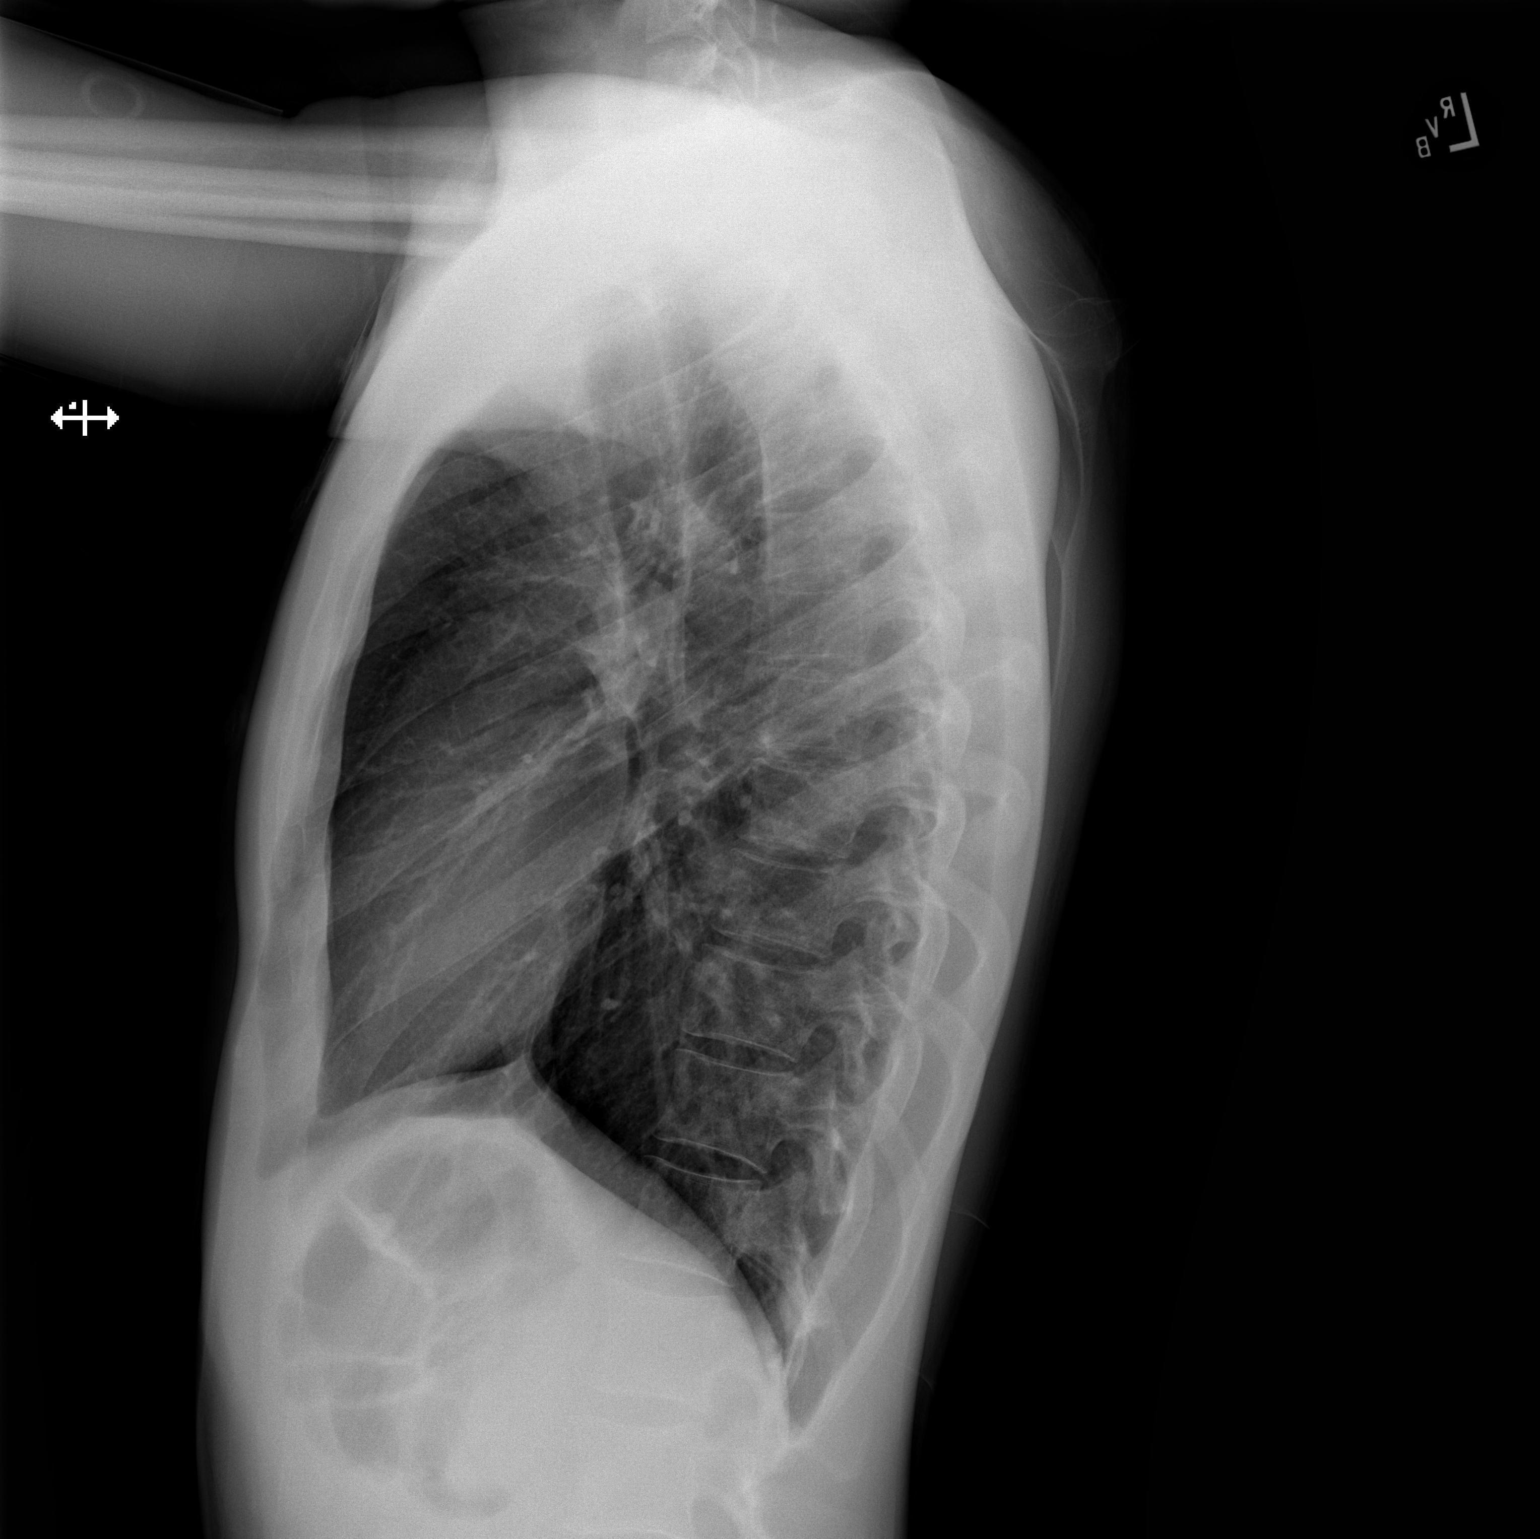

[2 of 2 positions shown; findings below may reference images not displayed]

FINDINGS: The heart size and mediastinal contours are within normal limits.
Both lungs are clear. No visible pleural effusions or pneumothorax.
No acute osseous abnormality.
IMPRESSION: No active cardiopulmonary disease.

## 2020-09-07 NOTE — ED Triage Notes (Signed)
Patient c/o intermittent mid abdominal pain and chest burning x 2 1/2 months. Patient states he went to a GI doctor 08/21/20. Patient states he was prescribed Omeprazole, but "has not been helping much."  Patient also c/o left wrist, forearm, and shoulder pain x 2-3 months. Patient states he saw an orthopedic doctor yesterday and was referred to physical therapy for his neck. Patient states he was told it was probably a pinched nerve.

## 2020-09-07 NOTE — ED Provider Notes (Signed)
[facility]-EMERGENCY DEPT Provider Note   CSN: [id number] Arrival date & time: 09/07/20  1224     History Chief Complaint  Patient presents with   Abdominal Pain   Chest Pain   Arm Pain    Richard Richmond is a 25 y.o. male.  Richard Richmond has been dealing with chest pain chronically for several months.  He recently saw GI and was placed on omeprazole.  Plan was to trial omeprazole and perform EGD if symptoms were resistant to this treatment.  The patient also a history of myopericarditis approximately 1 year ago.  This was associated with a COVID-19 vaccine.  It occurred several weeks after receiving 1 dose.  He states this pain is different than his pain he had with myopericarditis.  He states that over the past several months, he has had some weight loss mainly because he is trying to avoid any foods that exacerbate his GERD.  He states that he is compliant with his medication.  Of note, he does take meloxicam daily for some joint pain.  He has also seen orthopedics for some left arm pain, and this is to be treated conservatively with physical therapy for possible cervical radiculopathy.  The history is provided by the patient.  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 Ineffective treatments: omeprazole. Associated symptoms: abdominal pain   Associated symptoms: no cough, no lower extremity edema, no nausea, no shortness of breath and no vomiting   Arm Pain Associated symptoms include abdominal pain. Pertinent negatives include no shortness of breath.      Past Medical History:  Diagnosis Date   Abnormal liver function test    in context of  myopericarditis   GERD (gastroesophageal reflux disease)    Phreesia 06/05/2020   Myopericarditis     Patient Active Problem List   Diagnosis Date Noted   Other adverse food reactions, not elsewhere classified, subsequent encounter 08/09/2020   Rash and other nonspecific skin eruption 08/09/2020   Heartburn 08/09/2020   Gastrointestinal symptoms 08/09/2020   Myopericarditis 10/29/2019   Elevated liver function tests 10/29/2019   Thrombocytopenia - transient, resolved 10/29/2019    Past Surgical History:  Procedure Laterality Date   NO PAST SURGERIES         Family History  Problem Relation Age of Onset   Healthy Mother    Healthy Father     Social History   Tobacco Use   Smoking status: Former   Smokeless tobacco: Never  Vaping Use   Vaping Use: Never used  Substance Use Topics   Alcohol use: Not Currently    Comment: Stopped 08/2019   Drug use: Never    Home Medications Prior to Admission medications   Medication Sig Start Date End Date Taking? Authorizing Provider  desonide (DESOWEN) 0.05 % ointment Apply 1 application topically 2 (two) times daily as needed (rash). 08/09/20   Ellamae Sia, DO  famotidine (PEPCID) 40 MG tablet Take 1 tablet (40 mg total) by mouth daily. 08/09/20   Ellamae Sia, DO  gabapentin (NEURONTIN) 100 MG capsule 1 capsule at bedtime for 1 week, then 2 capsules at bedtime for 1 week,  then 3 capsules at bedtime 08/24/20   Drema Dallas, DO  meloxicam (MOBIC) 7.5 MG tablet Take 1 tablet (7.5 mg total) by mouth daily. 07/21/20 09/19/20  Rema Fendt, NP  omeprazole (PRILOSEC) 20 MG capsule Take 1 capsule (20 mg total) by mouth daily. 08/26/20 10/27/20  Jenel Lucks, MD    Allergies    Milk-related compounds and Other  Review of Systems   Review of Systems  HENT:  Negative for ear pain and sore throat.   Eyes:  Negative for pain and visual disturbance.  Respiratory:  Negative for cough and shortness of breath.   Gastrointestinal:   Positive for abdominal pain. Negative for nausea and vomiting.  Genitourinary:  Negative for dysuria and hematuria.  Skin:  Negative for color change and rash.  All other systems reviewed and are negative.  Physical Exam Updated Vital Signs BP 117/79 (BP Location: Left Arm)   Pulse 80   Temp 98.3 F (36.8 C) (Oral)   Resp 16   Ht 5' 6.5" (1.689 m)   Wt 49.9 kg   SpO2 98%   BMI 17.49 kg/m   Physical Exam Vitals and nursing note reviewed.  Constitutional:      Appearance: Normal appearance.  HENT:     Head: Normocephalic and atraumatic.  Cardiovascular:     Rate and Rhythm: Normal rate and regular rhythm.     Heart sounds: Normal heart sounds.  Pulmonary:     Effort: Pulmonary effort is normal.     Breath sounds: Normal breath sounds.  Abdominal:     General: There is no distension.     Tenderness: There is no abdominal tenderness. There is no guarding.  Musculoskeletal:     Cervical back: Normal range of motion.     Right lower leg: No edema.     Left lower leg: No edema.  Skin:    General: Skin is warm and dry.  Neurological:     General: No focal deficit present.     Mental Status: He is alert and oriented to person, place, and time.  Psychiatric:        Mood and Affect: Mood normal.        Behavior: Behavior normal.    ED Results / Procedures / Treatments   Labs (all labs ordered are listed, but only abnormal results are displayed) Labs Reviewed  COMPREHENSIVE METABOLIC PANEL - Abnormal; Notable for the following components:      Result Value   Total Protein 8.2 (*)    All other components within normal limits  CBC WITH DIFFERENTIAL/PLATELET  LIPASE, BLOOD  TROPONIN I (HIGH SENSITIVITY)    EKG EKG Interpretation  Date/Time:  Tuesday September 07 2020 13:28:19 EDT Ventricular Rate:  74 PR Interval:  135 QRS Duration: 82 QT Interval:  372 QTC Calculation: 413 R Axis:   90 Text Interpretation: Sinus rhythm Borderline right axis deviation 12 Lead;  Mason-Likar normal axis Confirmed by Pieter Partridge ([id number]) on 09/07/2020 5:08:37 PM  Radiology DG Chest 2 View  Result Date: 09/07/2020 CLINICAL DATA:  Chest pain. EXAM: CHEST - 2 VIEW COMPARISON:  Jul 17, 2020. FINDINGS: The heart size and mediastinal contours are within normal limits. Both lungs are clear. No visible pleural effusions or pneumothorax. No acute osseous abnormality. IMPRESSION: No active cardiopulmonary disease. Electronically Signed   By: Feliberto Harts MD   On: 09/07/2020 14:05   XR Cervical Spine 2 or 3 views  Result Date: 09/06/2020 Cervical spine AP lateral  views: No acute fractures.  No significant arthropathy.  Disc base overall well-maintained.  Loss of lordotic curvature.   Procedures Procedures   Medications Ordered in ED Medications - No data to display  ED Course  I have reviewed the triage vital signs and the nursing notes.  Pertinent labs & imaging results that were available during my care of the patient were reviewed by me and considered in my medical decision making (see chart for details).  Clinical Course as of 09/07/20 Loyal Buba Sep 07, 2020  1708 DG Chest 2 View [AW]    Clinical Course User Index [AW] Koleen Distance, MD   MDM Rules/Calculators/A&P                           Estell Harpin presents with chest pain.  He has had somewhat chronic chest pain that has been evaluated by cardiology and gastroenterology.  ACS was excluded.  I think his pain is most likely stemming from his diagnosis of GERD.  I have encouraged him to follow back up with GI for possible endoscopy as was the plan in their note.  The patient did not appear to have a surgical abdomen.  Abdominal imaging was deferred secondary to its low utility and side effects.  He was agreeable to follow-up with GI. Final Clinical Impression(s) / ED Diagnoses Final diagnoses:  Chest pain, unspecified type    Rx / DC Orders ED Discharge Orders     None        Koleen Distance,  MD 09/07/20 [phone]

## 2020-09-07 NOTE — ED Provider Notes (Signed)
Emergency Medicine Provider Triage Evaluation Note  Richard Richmond , a 25 y.o. male  was evaluated in triage.  Pt complains of chest pain, abdominal pain, and left upper extremity pain.  Patient states chest/upper abdominal pain has been present for the past 2 to 3 months.  He has been evaluated by GI on 7/2 where his pain was thought to be related to reflux.  Patient was discharged with omeprazole however, he notes it has not been helping with his symptoms.  Patient states he reported to the ED today because he had severe achy pain in his substernal region that lasted for roughly an hour when he woke up.  No associated shortness of breath, nausea, or vomiting.  No diaphoresis.  No fever or chills.  Patient has a history of myopericarditis.  Denies history of blood clots. He notes his pain is typically a burning sensation. No chronic alcohol use or NSAID use. He has been taking meloxicam for the past week for his LUE pain.  Patient also endorses left upper extremity pain for the past 2 to 3 months.  Patient was evaluated by orthopedics yesterday who recommended physical therapy.  X-ray was obtained of patient's cervical spine which he notes was unremarkable.  No known injury.  Patient denies associated weakness.  Review of Systems  Positive: CP, abdominal pain, arthralgia  Negative: weakness  Physical Exam  BP 108/80 (BP Location: Left Arm)   Pulse 66   Temp 98.3 F (36.8 C) (Oral)   Resp 16   Ht 5' 6.5" (1.689 m)   Wt 49.9 kg   SpO2 98%   BMI 17.49 kg/m  Gen:   Awake, no distress   Resp:  Normal effort  MSK:   Moves extremities without difficulty  Other:  No cervical midline tenderness. Equal grip strength  Medical Decision Making  Medically screening exam initiated at 1:06 PM.  Appropriate orders placed.  HOBY KAWAI was informed that the remainder of the evaluation will be completed by another provider, this initial triage assessment does not replace that evaluation, and the  importance of remaining in the ED until their evaluation is complete.  Equal grip strength- low suspicion for central cord compression, recommend PT per ortho Abdominal/cardiac labs ordered. Given burning sensation suspect GERD.    Mannie Stabile, PA-C 09/07/20 1309    Jacalyn Lefevre, MD 09/07/20 1345

## 2020-09-07 NOTE — ED Notes (Signed)
Brought pt a warm blanket

## 2020-09-07 NOTE — ED Notes (Signed)
Blood draw was unsuccessful x 1. Nurse aware.

## 2020-09-08 ENCOUNTER — Telehealth: Payer: Self-pay | Admitting: Gastroenterology

## 2020-09-08 NOTE — Telephone Encounter (Signed)
Inbound call from patient. States the omeprazole is working a little but he still have pain in top of stomach and really bad heart burn. Appt scheduled for 8/3. Would like a call back [phone]

## 2020-09-08 NOTE — Telephone Encounter (Signed)
Patient notified of new recommendations He has been scheduled for EGD and pre-visit for 7/21 and 8/5.   He declines carafate for now. He will call back if he changes his mind prior to EGD.  OV rescheduled for 8/26

## 2020-09-08 NOTE — Telephone Encounter (Signed)
Spoke with patient. Patient states is having increased, consistent upper abdominal pain. States it is a "gripping, stinging feeling. Reports he is still taking omeprazole and meloxicam, with no improvement. States symptoms have worsened in past week. Please advise, thank you.  Last OV:  08/26/20 Current Outpatient Medications on File Prior to Visit  Medication Sig Dispense Refill   desonide (DESOWEN) 0.05 % ointment Apply 1 application topically 2 (two) times daily as needed (rash). 15 g 2   famotidine (PEPCID) 40 MG tablet Take 1 tablet (40 mg total) by mouth daily. 30 tablet 2   gabapentin (NEURONTIN) 100 MG capsule 1 capsule at bedtime for 1 week, then 2 capsules at bedtime for 1 week, then 3 capsules at bedtime 90 capsule 0   meloxicam (MOBIC) 7.5 MG tablet Take 1 tablet (7.5 mg total) by mouth daily. 60 tablet 0   omeprazole (PRILOSEC) 20 MG capsule Take 1 capsule (20 mg total) by mouth daily. 60 capsule 0   No current facility-administered medications on file prior to visit.

## 2020-09-09 ENCOUNTER — Other Ambulatory Visit: Payer: Self-pay

## 2020-09-09 ENCOUNTER — Ambulatory Visit (AMBULATORY_SURGERY_CENTER): Payer: 59

## 2020-09-09 VITALS — Ht 66.0 in | Wt 110.0 lb

## 2020-09-09 DIAGNOSIS — K219 Gastro-esophageal reflux disease without esophagitis: Secondary | ICD-10-CM

## 2020-09-09 NOTE — Progress Notes (Signed)
Patient's pre-visit was done today over the phone with the patient.   Name,DOB and address verified.  Patient denies any allergies to Eggs and Soy. Patient denies any problems with anesthesia/sedation-has had none. Patient denies taking diet pills or blood thinners. No home Oxygen. Packet of Prep instructions mailed to patient including a copy of a consent form-pt is aware. Patient understands to call us back with any questions or concerns. Patient is aware of our care-partner policy and Covid-19 safety protocol.   EMMI education assigned to the patient for the procedure, sent to MyChart.   The patient is COVID-19 vaccinated, per patient.

## 2020-09-14 ENCOUNTER — Encounter: Payer: Self-pay | Admitting: Family

## 2020-09-22 ENCOUNTER — Ambulatory Visit: Payer: 59 | Admitting: Gastroenterology

## 2020-09-24 ENCOUNTER — Encounter: Payer: Self-pay | Admitting: Gastroenterology

## 2020-09-24 ENCOUNTER — Other Ambulatory Visit: Payer: Self-pay

## 2020-09-24 ENCOUNTER — Ambulatory Visit (AMBULATORY_SURGERY_CENTER): Payer: 59 | Admitting: Gastroenterology

## 2020-09-24 VITALS — BP 103/74 | HR 76 | Temp 97.5°F | Resp 16 | Ht 66.0 in | Wt 110.0 lb

## 2020-09-24 DIAGNOSIS — K219 Gastro-esophageal reflux disease without esophagitis: Secondary | ICD-10-CM | POA: Diagnosis not present

## 2020-09-24 DIAGNOSIS — R1013 Epigastric pain: Secondary | ICD-10-CM

## 2020-09-24 DIAGNOSIS — R12 Heartburn: Secondary | ICD-10-CM | POA: Diagnosis present

## 2020-09-24 MED ORDER — OMEPRAZOLE 20 MG PO CPDR: 20.0000 mg | DELAYED_RELEASE_CAPSULE | Freq: Two times a day (BID) | ORAL | 1 refills | Status: DC

## 2020-09-24 MED ORDER — SODIUM CHLORIDE 0.9 % IV SOLN: 500.0000 mL | Freq: Once | INTRAVENOUS | Status: DC

## 2020-09-24 NOTE — Progress Notes (Signed)
Called to room to assist during endoscopic procedure.  Patient ID and intended procedure confirmed with present staff. Received instructions for my participation in the procedure from the performing physician.  

## 2020-09-24 NOTE — Progress Notes (Signed)
Report to PACU, RN, vss, BBS= Clear.  

## 2020-09-24 NOTE — Progress Notes (Signed)
VS taken by DT 

## 2020-09-24 NOTE — Progress Notes (Signed)
Pt's states no medical or surgical changes since previsit or office visit. 

## 2020-09-24 NOTE — Op Note (Signed)
[facility] Patient Name: Richard Richmond Procedure Date: 09/24/2020 10:26 AM MRN: [id number] Endoscopist: Lorin Picket E. Tomasa Rand , MD Age: 25 Referring MD:  Date of Birth: 1995-12-05 Gender: Male Account #: [id number] Procedure:                Upper GI endoscopy Indications:              Epigastric abdominal pain, Heartburn Medicines:                Monitored Anesthesia Care Procedure:                Pre-Anesthesia Assessment:                           - Prior to the procedure, a History and Physical                            was performed, and patient medications and                            allergies were reviewed. The patient's tolerance of                            previous anesthesia was also reviewed. The risks                            and benefits of the procedure and the sedation                            options and risks were discussed with the patient.                            All questions were answered, and informed consent                            was obtained. Prior Anticoagulants: The patient has                            taken no previous anticoagulant or antiplatelet                            agents. ASA Grade Assessment: II - A patient with                            mild systemic disease. After reviewing the risks                            and benefits, the patient was deemed in                            satisfactory condition to undergo the procedure.                           After obtaining informed consent, the endoscope was  passed under direct vision. Throughout the                            procedure, the patient's blood pressure, pulse, and                            oxygen saturations were monitored continuously. The                            GIF [id number] #[id number] was introduced through the                            mouth, and advanced to the third part of duodenum.                            The upper GI  endoscopy was accomplished without                            difficulty. The patient tolerated the procedure                            well. Scope In: Scope Out: Findings:                 The examined portions of the nasopharynx,                            oropharynx and larynx were normal.                           The Z-line was irregular.                           The exam of the esophagus was otherwise normal.                           The entire examined stomach was normal. Biopsies                            were taken with a cold forceps for Helicobacter                            pylori testing. Estimated blood loss was minimal.                           The examined duodenum was normal. Complications:            No immediate complications. Estimated Blood Loss:     Estimated blood loss was minimal. Impression:               - The examined portions of the nasopharynx,                            oropharynx and larynx were normal.                           -  Z-line irregular. No evidence of reflux                            esophagitis.                           - Normal stomach. Biopsied.                           - Normal examined duodenum. Recommendation:           - Patient has a contact number available for                            emergencies. The signs and symptoms of potential                            delayed complications were discussed with the                            patient. Return to normal activities tomorrow.                            Written discharge instructions were provided to the                            patient.                           - Resume previous diet.                           - Continue present medications. Try increasing                            omeprazole to twice a day for 4 weeks to see if                            this improves symptoms.                           - Await pathology results.                           - If  symptoms persist despite high dose omeprazole,                            we can perform pH/impedance testing to further                            evaluate for evidence of esophageal reflux. Albertus Chiarelli E. Tomasa Rand, MD 09/24/2020 10:47:09 AM This report has been signed electronically.

## 2020-09-24 NOTE — Patient Instructions (Signed)
Continue present medications. Try increasing Omeprazole to twice a day for 4 weeks to see if this improves symptoms.   If symptoms persist despite high dose omeprazole, we can perform pH/impedance testing to further evaluate for evidences of esophageal reflux.   YOU HAD AN ENDOSCOPIC PROCEDURE TODAY AT THE [facility]:   Refer to the procedure report that was given to you for any specific questions about what was found during the examination.  If the procedure report does not answer your questions, please call your gastroenterologist to clarify.  If you requested that your care partner not be given the details of your procedure findings, then the procedure report has been included in a sealed envelope for you to review at your convenience later.  YOU SHOULD EXPECT: Some feelings of bloating in the abdomen. Passage of more gas than usual.  Walking can help get rid of the air that was put into your GI tract during the procedure and reduce the bloating. If you had a lower endoscopy (such as a colonoscopy or flexible sigmoidoscopy) you may notice spotting of blood in your stool or on the toilet paper. If you underwent a bowel prep for your procedure, you may not have a normal bowel movement for a few days.  Please Note:  You might notice some irritation and congestion in your nose or some drainage.  This is from the oxygen used during your procedure.  There is no need for concern and it should clear up in a day or so.  SYMPTOMS TO REPORT IMMEDIATELY:  Following upper endoscopy (EGD)  Vomiting of blood or coffee ground material  New chest pain or pain under the shoulder blades  Painful or persistently difficult swallowing  New shortness of breath  Fever of 100F or higher  Black, tarry-looking stools  For urgent or emergent issues, a gastroenterologist can be reached at any hour by calling [phone]. Do not use MyChart messaging for urgent concerns.    DIET:  We do recommend a  small meal at first, but then you may proceed to your regular diet.  Drink plenty of fluids but you should avoid alcoholic beverages for 24 hours.  ACTIVITY:  You should plan to take it easy for the rest of today and you should NOT DRIVE or use heavy machinery until tomorrow (because of the sedation medicines used during the test).    FOLLOW UP: Our staff will call the number listed on your records 48-72 hours following your procedure to check on you and address any questions or concerns that you may have regarding the information given to you following your procedure. If we do not reach you, we will leave a message.  We will attempt to reach you two times.  During this call, we will ask if you have developed any symptoms of COVID 19. If you develop any symptoms (ie: fever, flu-like symptoms, shortness of breath, cough etc.) before then, please call [phone].  If you test positive for Covid 19 in the 2 weeks post procedure, please call and report this information to [place].    If any biopsies were taken you will be contacted by phone or by letter within the next 1-3 weeks.  Please call us at [phone] if you have not heard about the biopsies in 3 weeks.    SIGNATURES/CONFIDENTIALITY: You and/or your care partner have signed paperwork which will be entered into your electronic medical record.  These signatures attest to the fact that that the information  above on your After Visit Summary has been reviewed and is understood.  Full responsibility of the confidentiality of this discharge information lies with you and/or your care-partner.

## 2020-09-28 ENCOUNTER — Telehealth: Payer: Self-pay | Admitting: *Deleted

## 2020-09-28 NOTE — Telephone Encounter (Signed)
  Follow up Call-  Call back number 09/24/2020  Post procedure Call Back phone  # [phone]  Permission to leave phone message Yes  Some recent data might be hidden     Patient questions:  Do you have a fever, pain , or abdominal swelling? No. Pain Score  0 *  Have you tolerated food without any problems? Yes.    Have you been able to return to your normal activities? Yes.    Do you have any questions about your discharge instructions: Diet   No. Medications  No. Follow up visit  No.  Do you have questions or concerns about your Care? No.  Actions: * If pain score is 4 or above: No action needed, pain <4.  Have you developed a fever since your procedure? no  2.   Have you had an respiratory symptoms (SOB or cough) since your procedure? no  3.   Have you tested positive for COVID 19 since your procedure no  4.   Have you had any family members/close contacts diagnosed with the COVID 19 since your procedure?  no   If yes to any of these questions please route to Laverna Peace, RN and Karlton Lemon, RN

## 2020-09-29 ENCOUNTER — Other Ambulatory Visit: Payer: Self-pay

## 2020-09-29 ENCOUNTER — Ambulatory Visit (INDEPENDENT_AMBULATORY_CARE_PROVIDER_SITE_OTHER): Payer: 59 | Admitting: Neurology

## 2020-09-29 DIAGNOSIS — M79602 Pain in left arm: Secondary | ICD-10-CM | POA: Diagnosis not present

## 2020-09-29 NOTE — Procedures (Signed)
[facility]  [address], [address]  [place], [place] [address] Tel: [phone] Fax:  [phone] Test Date:  09/29/2020  Patient: Richard Richmond DOB: 1995-08-06 Physician: Nita Sickle, DO  Sex: Male Height: 5\' 6"  Ref Phys: , D.O.  ID#: Shon Millet   Technician:    Patient Complaints: This is a 25 year old man referred for evaluation of diffuse left arm pain  NCV & EMG Findings: Extensive electrodiagnostic testing of the left upper extremity shows: Left median, ulnar, and mixed palmar sensory responses are within normal limits. Left median and ulnar motor responses are within normal limits. There is no evidence of active or chronic motor axonal loss changes affecting any of the tested muscles.  Motor unit configuration and recruitment pattern is within normal limits.  Impression: This is a normal study of the left upper extremity.  In particular, there is no evidence of a cervical radiculopathy, carpal tunnel syndrome, or ulnar neuropathy.   ___________________________ 30, DO    Nerve Conduction Studies Anti Sensory Summary Table   Stim Site NR Peak (ms) Norm Peak (ms) P-T Amp (V) Norm P-T Amp  Left Median Anti Sensory (2nd Digit)  34C  Wrist    3.2 <3.3 44.1 >20  Left Ulnar Anti Sensory (5th Digit)  34C  Wrist    3.0 <3.0 47.4 >18   Motor Summary Table   Stim Site NR Onset (ms) Norm Onset (ms) O-P Amp (mV) Norm O-P Amp Site1 Site2 Delta-0 (ms) Dist (cm) Vel (m/s) Norm Vel (m/s)  Left Median Motor (Abd Poll Brev)  34C  Wrist    3.3 <3.9 17.5 >6 Elbow Wrist 4.7 29.0 62 >51  Elbow    8.0  17.1         Left Ulnar Motor (Abd Dig Minimi)  34C  Wrist    2.6 <3.0 9.3 >8 B Elbow Wrist 4.0 22.0 55 >51  B Elbow    6.6  9.1  A Elbow B Elbow 1.7 10.0 59 >51  A Elbow    8.3  8.7          Comparison Summary Table   Stim Site NR Peak (ms) Norm Peak (ms) P-T Amp (V) Site1 Site2 Delta-P (ms) Norm Delta (ms)  Left Median/Ulnar Palm  Comparison (Wrist - 8cm)  34C  Median Palm    1.7 <2.2 122.0 Median Palm Ulnar Palm 0.3   Ulnar Palm    1.4 <2.2 28.9       EMG   Side Muscle Ins Act Fibs Psw Fasc Number Recrt Dur Dur. Amp Amp. Poly Poly. Comment  Left 1stDorInt Nml Nml Nml Nml Nml Nml Nml Nml Nml Nml Nml Nml N/A  Left PronatorTeres Nml Nml Nml Nml Nml Nml Nml Nml Nml Nml Nml Nml N/A  Left Biceps Nml Nml Nml Nml Nml Nml Nml Nml Nml Nml Nml Nml N/A  Left Triceps Nml Nml Nml Nml Nml Nml Nml Nml Nml Nml Nml Nml N/A  Left Deltoid Nml Nml Nml Nml Nml Nml Nml Nml Nml Nml Nml Nml N/A      Waveforms:

## 2020-10-05 ENCOUNTER — Encounter: Payer: 59 | Admitting: *Deleted

## 2020-10-05 ENCOUNTER — Other Ambulatory Visit: Payer: Self-pay

## 2020-10-05 NOTE — Patient Instructions (Signed)
Patient is aware of symptoms worsen or change to please return for evaluation.

## 2020-10-06 NOTE — Progress Notes (Signed)
Tried calling pt no answer. VM not set unable to LVM.

## 2020-10-11 NOTE — Progress Notes (Signed)
Pt advised EMG normal.

## 2020-10-15 ENCOUNTER — Encounter: Payer: Self-pay | Admitting: Gastroenterology

## 2020-10-15 ENCOUNTER — Ambulatory Visit (INDEPENDENT_AMBULATORY_CARE_PROVIDER_SITE_OTHER): Payer: 59 | Admitting: Gastroenterology

## 2020-10-15 VITALS — BP 118/70 | HR 75 | Ht 66.0 in | Wt 111.4 lb

## 2020-10-15 DIAGNOSIS — K219 Gastro-esophageal reflux disease without esophagitis: Secondary | ICD-10-CM | POA: Diagnosis not present

## 2020-10-15 NOTE — Progress Notes (Signed)
HPI : 25 y/o male with history of myopericarditis presents to our clinic today for follow up of abdominal pain and GERD symptoms.  He underwent an EGD a month ago which showed an irregular Z line, but was otherwise unremarkable.  Gastric biopsies were normal, no H. Pylori detected.  Overall, the patient is doing much better now with minimal pain.  He has occasional episodes of abdominal discomfort which are usually related to dietary indiscretions.  He has been avoiding fried/greasy foods for the most part and this has been effective in preventing symptoms.  He stoppped taking the omeprazole because he does not feel like he needs it any longer, and he has done fine without it. He is having regular bowel movements, without diarrhea or constipation. He has been having issues with pain tingling in his arm and recently underwent an EMG which was normal.   Past Medical History:  Diagnosis Date   Abnormal liver function test    in context of myopericarditis   GERD (gastroesophageal reflux disease)    Phreesia 06/05/2020   Myopericarditis      Past Surgical History:  Procedure Laterality Date   NO PAST SURGERIES     Family History  Problem Relation Age of Onset   Healthy Mother    Healthy Father    Colon polyps Maternal Grandmother    Colon cancer Neg Hx    Esophageal cancer Neg Hx    Rectal cancer Neg Hx    Stomach cancer Neg Hx    Social History   Tobacco Use   Smoking status: Former   Smokeless tobacco: Never  Vaping Use   Vaping Use: Never used  Substance Use Topics   Alcohol use: Not Currently    Comment: Stopped 08/2019   Drug use: Never   Current Outpatient Medications  Medication Sig Dispense Refill   omeprazole (PRILOSEC) 20 MG capsule Take 1 capsule (20 mg total) by mouth daily. 60 capsule 0   No current facility-administered medications for this visit.   Allergies  Allergen Reactions   Milk-Related Compounds    Other     Tree nuts and shellfish      Review of Systems: All systems reviewed and negative except where noted in HPI.    NCV with EMG(electromyography)  Result Date: 09/29/2020 Glendale Chard, DO     09/29/2020  2:18 PM [facility] [address] [place], [address]  [place], [place] [address] Tel: [phone] Fax:  [phone] Test Date:  09/29/2020 Patient: Richard Richmond DOB: August 07, 1995 Physician: Nita Sickle, DO Sex: Male Height: 5\' 6"  Ref Phys: , D.O. ID#: Shon Millet   Technician:  Patient Complaints: This is a 25 year old man referred for evaluation of diffuse left arm pain NCV & EMG Findings: Extensive electrodiagnostic testing of the left upper extremity shows: Left median, ulnar, and mixed palmar sensory responses are within normal limits. Left median and ulnar motor responses are within normal limits. There is no evidence of active or chronic motor axonal loss changes affecting any of the tested muscles.  Motor unit configuration and recruitment pattern is within normal limits. Impression: This is a normal study of the left upper extremity.  In particular, there is no evidence of a cervical radiculopathy, carpal tunnel syndrome, or ulnar neuropathy. ___________________________ 30, DO Nerve Conduction Studies Anti Sensory Summary Table  Stim Site NR Peak (ms) Norm Peak (ms) P-T Amp (V) Norm P-T Amp Left Median Anti Sensory (2nd Digit)  34C Wrist  3.2 <3.3 44.1 >20 Left Ulnar Anti Sensory (5th Digit)  34C Wrist    3.0 <3.0 47.4 >18 Motor Summary Table  Stim Site NR Onset (ms) Norm Onset (ms) O-P Amp (mV) Norm O-P Amp Site1 Site2 Delta-0 (ms) Dist (cm) Vel (m/s) Norm Vel (m/s) Left Median Motor (Abd Poll Brev)  34C Wrist    3.3 <3.9 17.5 >6 Elbow Wrist 4.7 29.0 62 >51 Elbow    8.0  17.1        Left Ulnar Motor (Abd Dig Minimi)  34C Wrist    2.6 <3.0 9.3 >8 B Elbow Wrist 4.0 22.0 55 >51 B Elbow    6.6  9.1  A Elbow B Elbow 1.7 10.0 59 >51 A Elbow    8.3  8.7        Comparison Summary Table  Stim Site  NR Peak (ms) Norm Peak (ms) P-T Amp (V) Site1 Site2 Delta-P (ms) Norm Delta (ms) Left Median/Ulnar Palm Comparison (Wrist - 8cm)  34C Median Palm    1.7 <2.2 122.0 Median Palm Ulnar Palm 0.3  Ulnar Palm    1.4 <2.2 28.9     EMG  Side Muscle Ins Act Fibs Psw Fasc Number Recrt Dur Dur. Amp Amp. Poly Poly. Comment Left 1stDorInt Nml Nml Nml Nml Nml Nml Nml Nml Nml Nml Nml Nml N/A Left PronatorTeres Nml Nml Nml Nml Nml Nml Nml Nml Nml Nml Nml Nml N/A Left Biceps Nml Nml Nml Nml Nml Nml Nml Nml Nml Nml Nml Nml N/A Left Triceps Nml Nml Nml Nml Nml Nml Nml Nml Nml Nml Nml Nml N/A Left Deltoid Nml Nml Nml Nml Nml Nml Nml Nml Nml Nml Nml Nml N/A Waveforms:         Physical Exam: BP 118/70   Pulse 75   Ht 5\' 6"  (1.676 m)   Wt 111 lb 6.4 oz (50.5 kg)   SpO2 99%   BMI 17.98 kg/m  Constitutional: Pleasant,well-developed, male in no acute distress. HEENT: Normocephalic and atraumatic. Conjunctivae are normal. No scleral icterus. Abdominal: Soft, nondistended, nontender. Bowel sounds active throughout. There are no masses palpable. No hepatomegaly. Extremities: no edema Neurological: Alert and oriented to person place and time. Skin: Skin is warm and dry. No rashes noted. Psychiatric: Normal mood and affect. Behavior is normal.  CBC    Component Value Date/Time   WBC 7.7 09/07/2020 1708   RBC 5.21 09/07/2020 1708   HGB 14.5 09/07/2020 1708   HCT 43.3 09/07/2020 1708   PLT 218 09/07/2020 1708   MCV 83.1 09/07/2020 1708   MCH 27.8 09/07/2020 1708   MCHC 33.5 09/07/2020 1708   RDW 11.9 09/07/2020 1708   LYMPHSABS 1.8 09/07/2020 1708   MONOABS 0.5 09/07/2020 1708   EOSABS 0.1 09/07/2020 1708   BASOSABS 0.0 09/07/2020 1708    CMP     Component Value Date/Time   NA 139 09/07/2020 1708   NA 141 12/08/2019 1146   K 4.0 09/07/2020 1708   CL 101 09/07/2020 1708   CO2 27 09/07/2020 1708   GLUCOSE 83 09/07/2020 1708   BUN 15 09/07/2020 1708   BUN 14 12/08/2019 1146   CREATININE 0.78 09/07/2020  1708   CALCIUM 9.9 09/07/2020 1708   PROT 8.2 (H) 09/07/2020 1708   PROT 7.6 12/08/2019 1146   ALBUMIN 5.0 09/07/2020 1708   ALBUMIN 5.0 12/08/2019 1146   AST 20 09/07/2020 1708   ALT 14 09/07/2020 1708   ALKPHOS 68 09/07/2020 1708   BILITOT 0.7  09/07/2020 1708   BILITOT 0.3 12/08/2019 1146   GFRNONAA >60 09/07/2020 1708   GFRAA 141 12/08/2019 1146   EGD: July 2022:   Irregular z line, otherwise normal with normal gastric biopsies  ASSESSMENT AND PLAN: 25 year old male with history of GERD symptoms and abdominal pain, with unremarkable EGD, without adequate response to PPI, but with subsequent resolution of symptoms with dietary changes.  It is unclear to me if all the patient's symptoms were completely due to GERD versus functional etiology.   The presence of the irregular Z line on his EGD is suggestive of reflux. He is currently doing well with minimal symptoms on no medications.  I encouraged him to continue to follow his dietary changes and avoiding fatty and greasy foods as this would help with his GERD as well as his overall health.  No need for PPI therapy in the absence of symptoms.  GERD - Continue dietary modifications, no need for PPI  Kaedance Magos E. Tomasa Rand, MD [facility]    Rema Fendt, NP

## 2020-10-15 NOTE — Patient Instructions (Addendum)
It was my pleasure to provide care to you today. Based on our discussion, I am providing you with my recommendations below:  RECOMMENDATION(S):   I did not make any changes to your regimen  FOLLOW UP:  I would like for you to follow up with me as needed. If your symptoms should recur, please call the office at [phone] to schedule your appointment.  BMI:  If you are age 26 or younger, your body mass index should be between 19-25. Your There is no height or weight on file to calculate BMI. If this is out of the aformentioned range listed, please consider follow up with your Primary Care Provider.   MY CHART:  The [facility] providers would like to encourage you to use [facility] to communicate with providers for non-urgent requests or questions.  Due to long hold times on the telephone, sending your provider a message by [facility] may be a faster and more efficient way to get a response.  Please allow 48 business hours for a response.  Please remember that this is for non-urgent requests.   Thank you for trusting me with your gastrointestinal care!    Scott E. Tomasa Rand, MD

## 2020-10-19 ENCOUNTER — Emergency Department (HOSPITAL_COMMUNITY): Payer: 59

## 2020-10-19 ENCOUNTER — Emergency Department (HOSPITAL_COMMUNITY)
Admission: EM | Admit: 2020-10-19 | Discharge: 2020-10-19 | Disposition: A | Discharge status: Home / Self Care | Payer: 59 | Attending: Student | Admitting: Student

## 2020-10-19 ENCOUNTER — Encounter (HOSPITAL_COMMUNITY): Payer: Self-pay | Admitting: *Deleted

## 2020-10-19 DIAGNOSIS — R509 Fever, unspecified: Secondary | ICD-10-CM | POA: Diagnosis present

## 2020-10-19 DIAGNOSIS — U071 COVID-19: Secondary | ICD-10-CM | POA: Diagnosis not present

## 2020-10-19 DIAGNOSIS — Z87891 Personal history of nicotine dependence: Secondary | ICD-10-CM | POA: Insufficient documentation

## 2020-10-19 LAB — CBC WITH DIFFERENTIAL/PLATELET
Abs Immature Granulocytes: 0.01 10*3/uL (ref 0.00–0.07)
Basophils Absolute: 0 10*3/uL (ref 0.0–0.1)
Basophils Relative: 1 %
Eosinophils Absolute: 0 10*3/uL (ref 0.0–0.5)
Eosinophils Relative: 0 %
HCT: 41 % (ref 39.0–52.0)
Hemoglobin: 13.6 g/dL (ref 13.0–17.0)
Immature Granulocytes: 0 %
Lymphocytes Relative: 24 %
Lymphs Abs: 1 10*3/uL (ref 0.7–4.0)
MCH: 28 pg (ref 26.0–34.0)
MCHC: 33.2 g/dL (ref 30.0–36.0)
MCV: 84.4 fL (ref 80.0–100.0)
Monocytes Absolute: 0.6 10*3/uL (ref 0.1–1.0)
Monocytes Relative: 15 %
Neutro Abs: 2.4 10*3/uL (ref 1.7–7.7)
Neutrophils Relative %: 60 %
Platelets: 153 10*3/uL (ref 150–400)
RBC: 4.86 MIL/uL (ref 4.22–5.81)
RDW: 12.4 % (ref 11.5–15.5)
WBC: 4 10*3/uL (ref 4.0–10.5)
nRBC: 0 % (ref 0.0–0.2)

## 2020-10-19 LAB — COMPREHENSIVE METABOLIC PANEL
ALT: 20 U/L (ref 0–44)
AST: 28 U/L (ref 15–41)
Albumin: 4.4 g/dL (ref 3.5–5.0)
Alkaline Phosphatase: 60 U/L (ref 38–126)
Anion gap: 9 (ref 5–15)
BUN: 19 mg/dL (ref 6–20)
CO2: 29 mmol/L (ref 22–32)
Calcium: 9.8 mg/dL (ref 8.9–10.3)
Chloride: 100 mmol/L (ref 98–111)
Creatinine, Ser: 0.8 mg/dL (ref 0.61–1.24)
GFR, Estimated: >60 mL/min (ref >60)
Glucose, Bld: 86 mg/dL (ref 70–99)
Potassium: 4 mmol/L (ref 3.5–5.1)
Sodium: 138 mmol/L (ref 135–145)
Total Bilirubin: 0.7 mg/dL (ref 0.3–1.2)
Total Protein: 7.5 g/dL (ref 6.5–8.1)

## 2020-10-19 LAB — RESP PANEL BY RT-PCR (FLU A&B, COVID) ARPGX2
Influenza A by PCR: NEGATIVE
Influenza B by PCR: NEGATIVE
SARS Coronavirus 2 by RT PCR: POSITIVE — AB

## 2020-10-19 LAB — TROPONIN I (HIGH SENSITIVITY)
Troponin I (High Sensitivity): 3 ng/L (ref <18)
Troponin I (High Sensitivity): 3 ng/L (ref <18)

## 2020-10-19 IMAGING — CR DG CHEST 2V
2 series · 2 of 2 positions shown · non-contrast
Comparison: Chest radiograph [DATE]

CLINICAL DATA: Chest pain, body aches, fever

EXAM:
CHEST - 2 VIEW

[w chest pa]
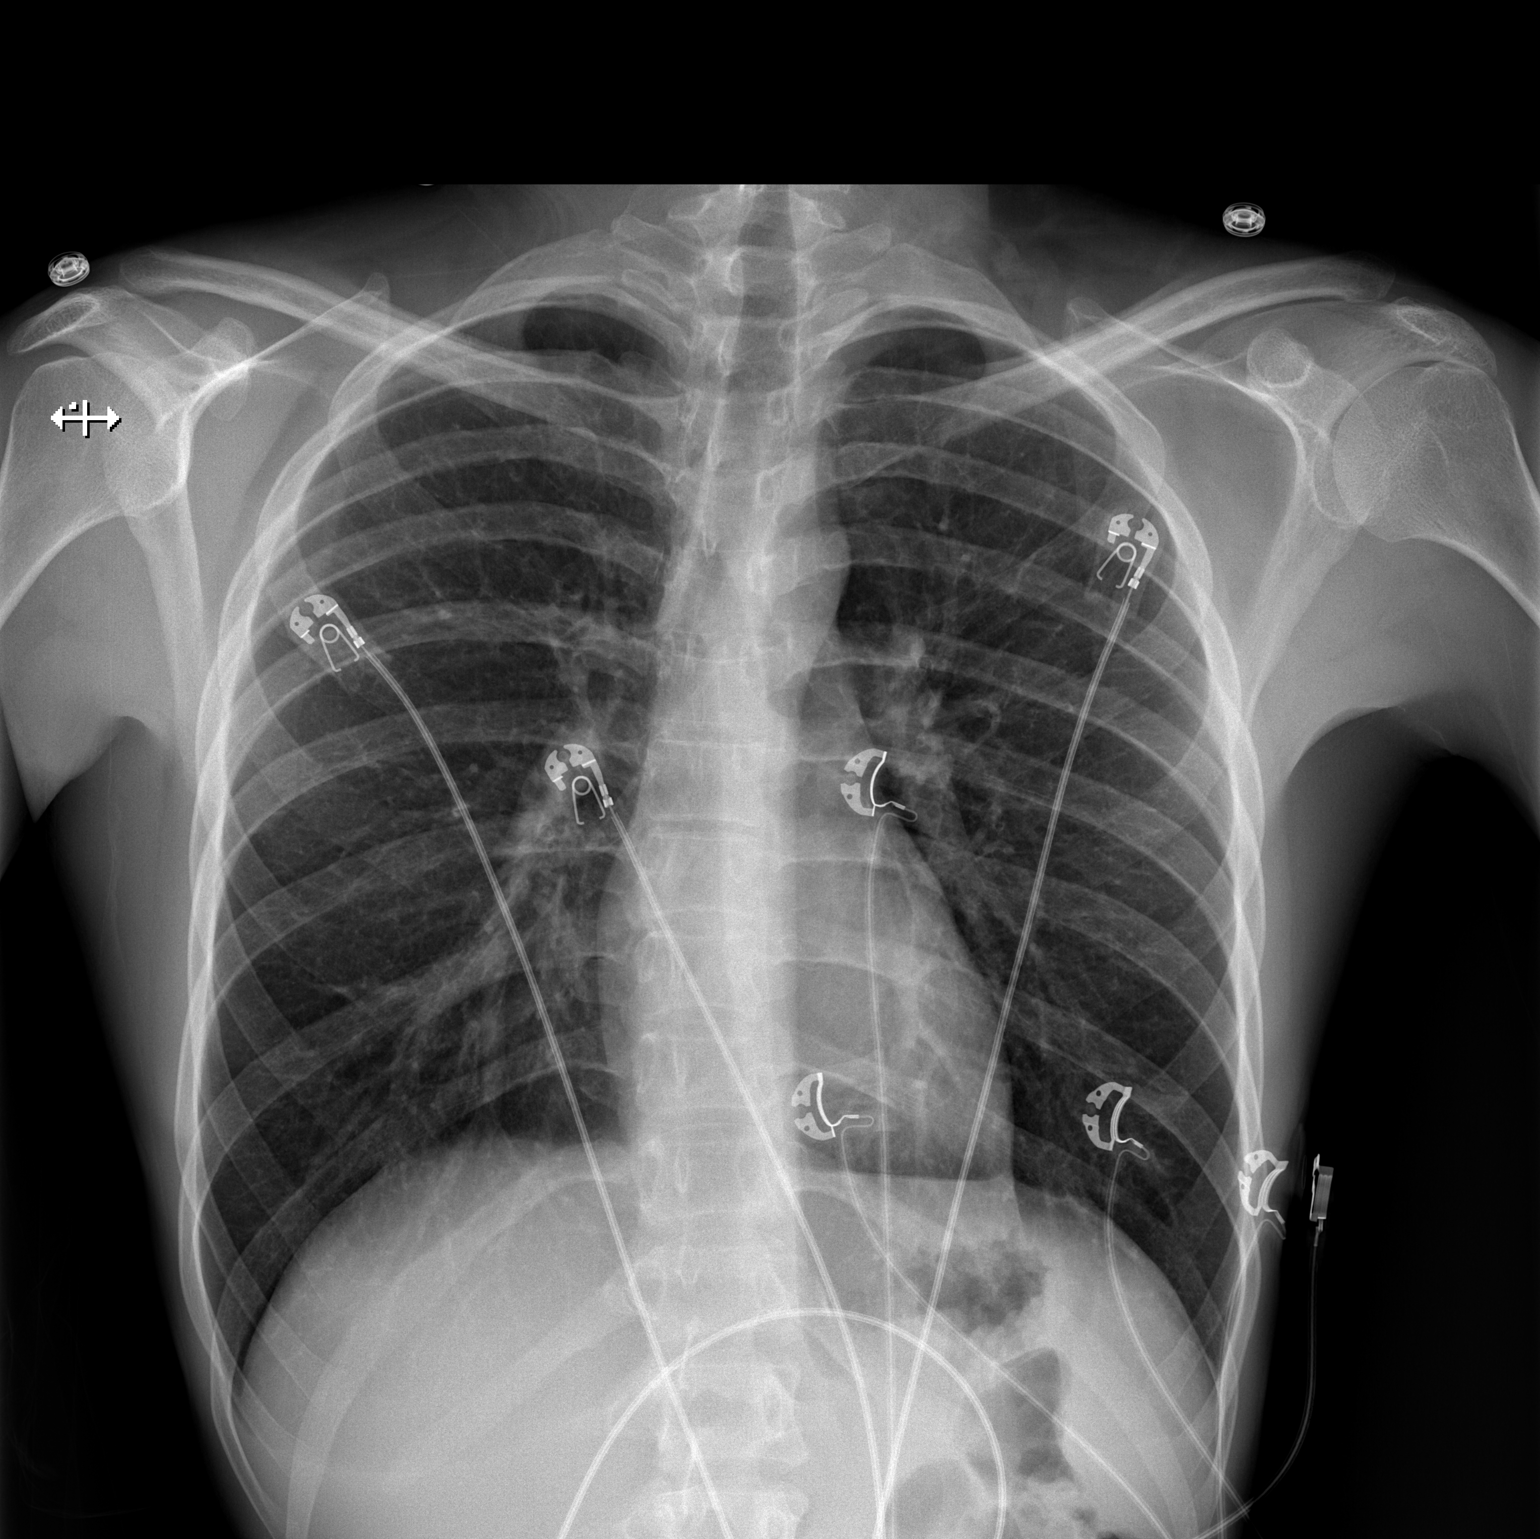

[w chest lat]
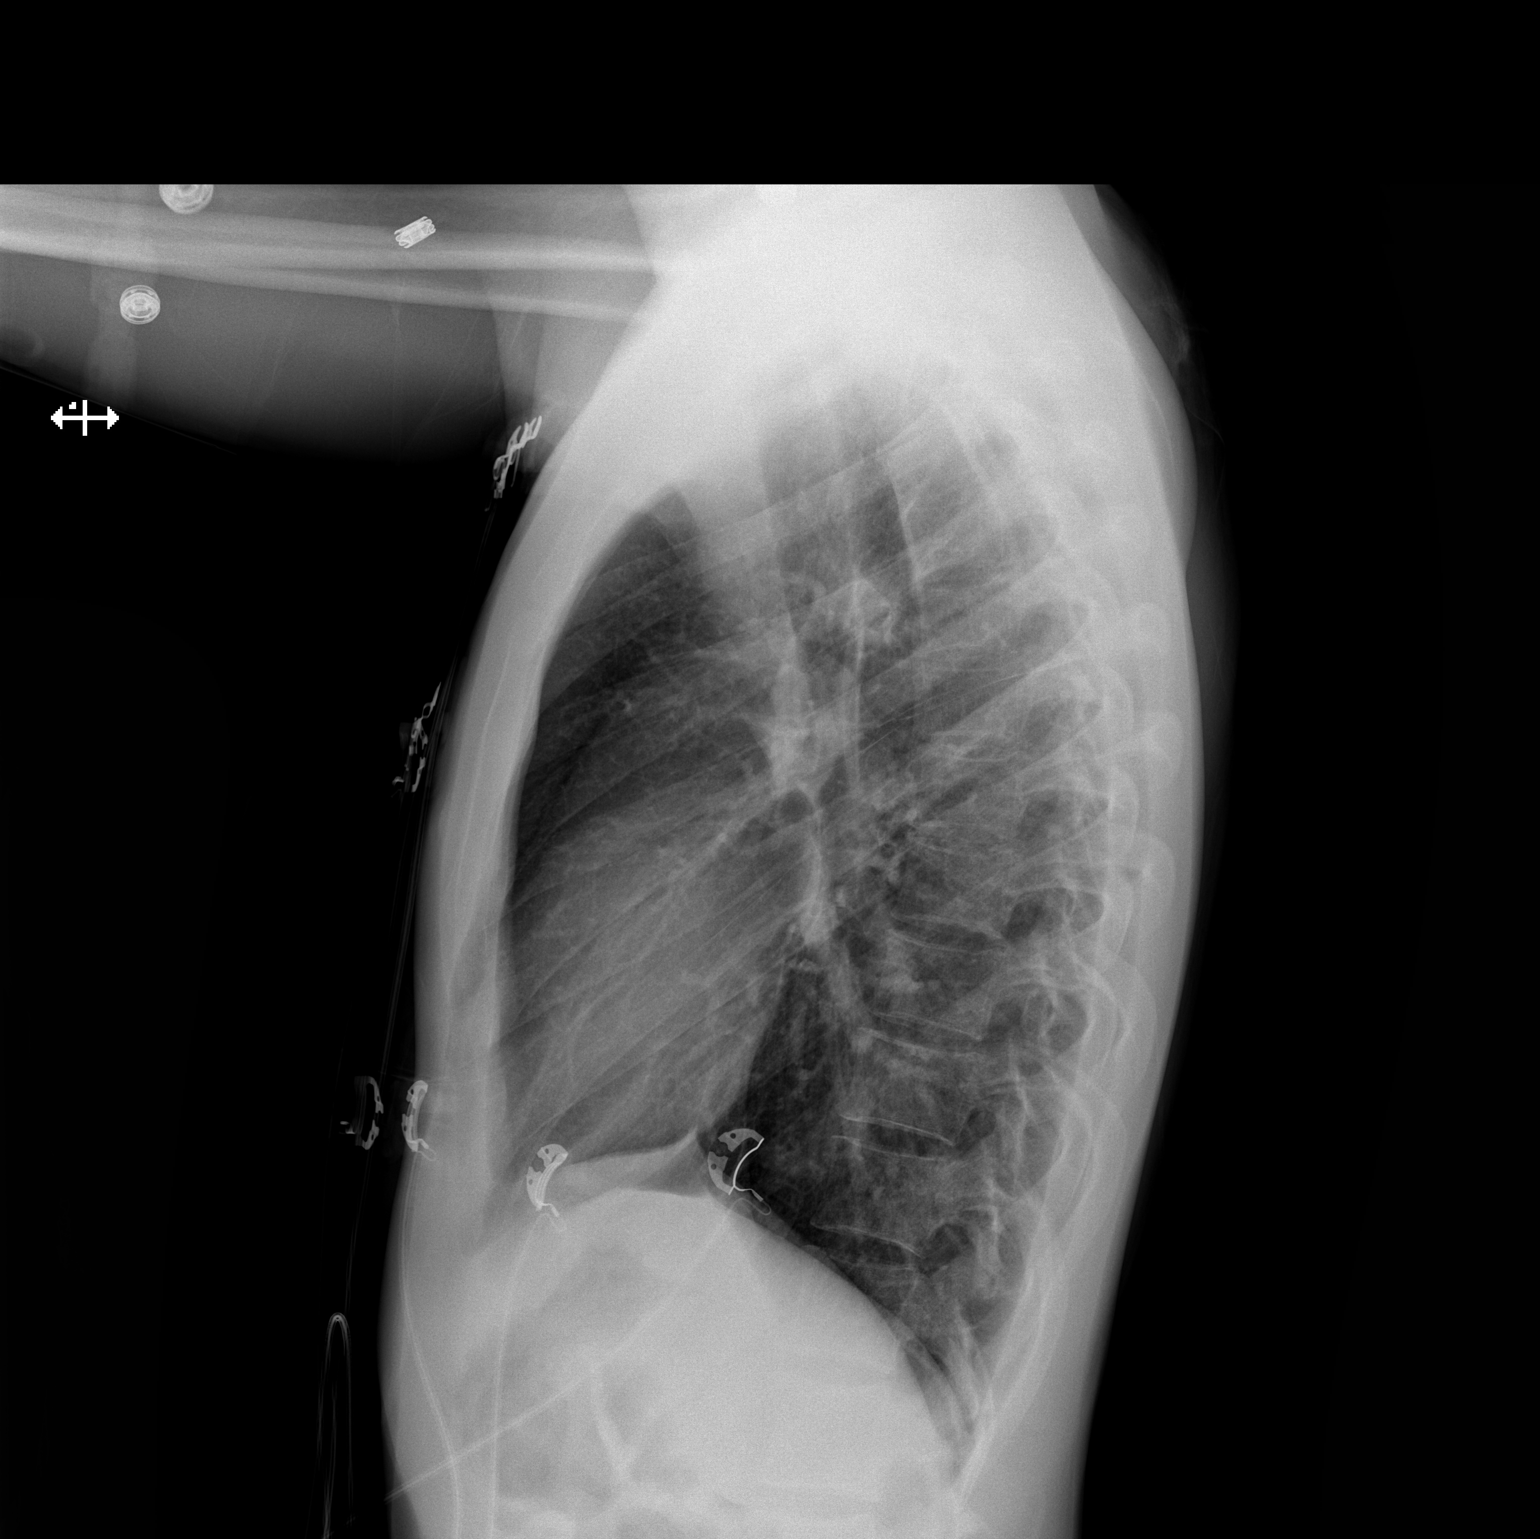

[2 of 2 positions shown; findings below may reference images not displayed]

FINDINGS: The cardiomediastinal silhouette is normal.

The lungs clear, with no focal consolidation or pulmonary edema.
There is no pleural effusion or pneumothorax.

There is slight dextrocurvature of the thoracic spine. There is no
acute osseous abnormality.
IMPRESSION: No radiographic evidence of acute cardiopulmonary process.

## 2020-10-19 MED ORDER — KETOROLAC TROMETHAMINE 15 MG/ML IJ SOLN
15.0000 mg | Freq: Once | INTRAMUSCULAR | Status: AC
  Administered 2020-10-19: 15 mg via INTRAVENOUS
  Filled 2020-10-19: qty 1

## 2020-10-19 MED ORDER — LACTATED RINGERS IV BOLUS
1000.0000 mL | Freq: Once | INTRAVENOUS | Status: AC
  Administered 2020-10-19: 1000 mL via INTRAVENOUS

## 2020-10-19 NOTE — ED Notes (Signed)
ED Provider at bedside. 

## 2020-10-19 NOTE — ED Triage Notes (Signed)
Pt complains of cough,chest pain, body aches, fever x 3 days. Took ibuprofen earlier today.

## 2020-10-19 NOTE — ED Provider Notes (Signed)
[facility]   CSN: [id number] Arrival date & time: 10/19/20  1416     History Chief Complaint  Patient presents with   Fever   Chest Pain   Generalized Body Aches    Richard Richmond is a 25 y.o. male with PMH myopericarditis who presents the emergency department for fever, chest pain and generalized body aches.  Patient states that he has had the symptoms for approximately 3 days and is primarily concerned about his myalgias.  He also endorses chest pain that radiates bilaterally around the lower chest into the back.  Pain is nonexertional and is reproducible with movement.  Denies cough, shortness of breath, vomiting, diarrhea or other systemic symptoms.  Patient states that his T-max was 101.0 and he took ibuprofen prior to arrival to the emergency department today.  Patient states he is partially vaccinated, received 1 dose of Pfizer vaccine but did not receive additional doses.  HPI     Past Medical History:  Diagnosis Date   Abnormal liver function test    in context of myopericarditis   GERD (gastroesophageal reflux disease)    Phreesia 06/05/2020   Myopericarditis     Patient Active Problem List   Diagnosis Date Noted   Other adverse food reactions, not elsewhere classified, subsequent encounter 08/09/2020   Rash and other nonspecific skin eruption 08/09/2020   Heartburn 08/09/2020   Gastrointestinal symptoms 08/09/2020   Myopericarditis 10/29/2019   Elevated liver function tests 10/29/2019   Thrombocytopenia - transient, resolved 10/29/2019    Past Surgical History:  Procedure Laterality Date   NO PAST SURGERIES         Family History  Problem Relation Age of Onset   Healthy Mother    Healthy Father    Colon polyps Maternal Grandmother    Colon cancer Neg Hx    Esophageal cancer Neg Hx    Rectal cancer Neg Hx    Stomach cancer Neg Hx     Social History   Tobacco Use   Smoking status: Former    Smokeless tobacco: Never  Vaping Use   Vaping Use: Never used  Substance Use Topics   Alcohol use: Not Currently    Comment: Stopped 08/2019   Drug use: Never    Home Medications Prior to Admission medications   Medication Sig Start Date End Date Taking? Authorizing Provider  Ascorbic Acid (VITAMIN C PO) Take 1 tablet by mouth daily.   Yes [provider]  ibuprofen (ADVIL) 200 MG tablet Take 400 mg by mouth every 6 (six) hours as needed for headache or mild pain.   Yes [provider]  omeprazole (PRILOSEC) 20 MG capsule Take 1 capsule (20 mg total) by mouth daily. Patient taking differently: Take 20 mg by mouth daily as needed (acid reflux). 08/26/20 10/27/20 Yes Jenel Lucks, MD  VITAMIN D PO Take 1 tablet by mouth daily.   Yes [provider]    Allergies    Milk-related compounds and Other  Review of Systems   Review of Systems  Constitutional:  Positive for chills, fatigue and fever.  HENT:  Negative for ear pain and sore throat.   Eyes:  Negative for pain and visual disturbance.  Respiratory:  Negative for cough and shortness of breath.   Cardiovascular:  Positive for chest pain. Negative for palpitations.  Gastrointestinal:  Negative for abdominal pain and vomiting.  Genitourinary:  Negative for dysuria and hematuria.  Musculoskeletal:  Positive for myalgias.  Negative for arthralgias and back pain.  Skin:  Negative for color change and rash.  Neurological:  Negative for seizures and syncope.  All other systems reviewed and are negative.  Physical Exam Updated Vital Signs BP 105/73 (BP Location: Left Arm)   Pulse 85   Temp 98.6 F (37 C) (Oral)   Resp (!) 22   SpO2 100%   Physical Exam Vitals and nursing note reviewed.  Constitutional:      Appearance: He is well-developed.  HENT:     Head: Normocephalic and atraumatic.  Eyes:     Conjunctiva/sclera: Conjunctivae normal.  Cardiovascular:     Rate and Rhythm: Normal rate and  regular rhythm.     Heart sounds: No murmur heard. Pulmonary:     Effort: Pulmonary effort is normal. No respiratory distress.     Breath sounds: Normal breath sounds.  Abdominal:     Palpations: Abdomen is soft.     Tenderness: There is no abdominal tenderness.  Musculoskeletal:     Cervical back: Neck supple.  Skin:    General: Skin is warm and dry.  Neurological:     Mental Status: He is alert.    ED Results / Procedures / Treatments   Labs (all labs ordered are listed, but only abnormal results are displayed) Labs Reviewed  RESP PANEL BY RT-PCR (FLU A&B, COVID) ARPGX2 - Abnormal; Notable for the following components:      Result Value   SARS Coronavirus 2 by RT PCR POSITIVE (*)    All other components within normal limits  COMPREHENSIVE METABOLIC PANEL  CBC WITH DIFFERENTIAL/PLATELET  TROPONIN I (HIGH SENSITIVITY)  TROPONIN I (HIGH SENSITIVITY)    EKG EKG Interpretation  Date/Time:  Tuesday October 19 2020 15:48:06 EDT Ventricular Rate:  76 PR Interval:  142 QRS Duration: 79 QT Interval:  369 QTC Calculation: 415 R Axis:   92 Text Interpretation: Sinus rhythm Borderline right axis deviation Confirmed by Haide Klinker ([id number]) on 10/19/2020 11:17:47 PM  Radiology DG Chest 2 View  Result Date: 10/19/2020 CLINICAL DATA:  Chest pain, body aches, fever EXAM: CHEST - 2 VIEW COMPARISON:  Chest radiograph 09/07/2020 FINDINGS: The cardiomediastinal silhouette is normal. The lungs clear, with no focal consolidation or pulmonary edema. There is no pleural effusion or pneumothorax. There is slight dextrocurvature of the thoracic spine. There is no acute osseous abnormality. IMPRESSION: No radiographic evidence of acute cardiopulmonary process. Electronically Signed   By: Lesia Hausen M.D.   On: 10/19/2020 17:21    Procedures Procedures   Medications Ordered in ED Medications  lactated ringers bolus 1,000 mL (0 mLs Intravenous Stopped 10/19/20 1742)  ketorolac (TORADOL) 15  MG/ML injection 15 mg (15 mg Intravenous Given 10/19/20 1933)    ED Course  I have reviewed the triage vital signs and the nursing notes.  Pertinent labs & imaging results that were available during my care of the patient were reviewed by me and considered in my medical decision making (see chart for details).    MDM Rules/Calculators/A&P                           Patient seen emergency department for evaluation of fever, chills, myalgias and chest pain.  Physical exam is largely unremarkable.  Patient unremarkable.  Patient is COVID-positive.  High sensitive troponin unremarkable.  ECG nonischemic.  Patient given Toradol for myalgias.  Encouraged to take ibuprofen and Tylenol at home for persistent myalgias.  Patient does  not meet criteria for paxlovid at this time and was discharged. Final Clinical Impression(s) / ED Diagnoses Final diagnoses:  COVID-19    Rx / DC Orders ED Discharge Orders     None        Meaghan Whistler, Wyn Forster, MD 10/19/20 2318

## 2020-10-26 ENCOUNTER — Ambulatory Visit: Payer: 59 | Admitting: Family

## 2020-11-24 ENCOUNTER — Encounter: Payer: Self-pay | Admitting: Neurology

## 2020-11-28 ENCOUNTER — Encounter: Payer: Self-pay | Admitting: Family

## 2020-12-03 ENCOUNTER — Other Ambulatory Visit: Payer: Self-pay | Admitting: Family

## 2020-12-03 DIAGNOSIS — M791 Myalgia, unspecified site: Secondary | ICD-10-CM

## 2020-12-10 ENCOUNTER — Ambulatory Visit: Payer: Self-pay

## 2020-12-10 ENCOUNTER — Other Ambulatory Visit: Payer: Self-pay

## 2020-12-10 ENCOUNTER — Ambulatory Visit (INDEPENDENT_AMBULATORY_CARE_PROVIDER_SITE_OTHER): Payer: 59 | Admitting: Orthopaedic Surgery

## 2020-12-10 ENCOUNTER — Encounter: Payer: Self-pay | Admitting: Orthopaedic Surgery

## 2020-12-10 VITALS — BP 97/62 | HR 73 | Temp 98.4°F

## 2020-12-10 DIAGNOSIS — R079 Chest pain, unspecified: Secondary | ICD-10-CM | POA: Diagnosis not present

## 2020-12-10 NOTE — Progress Notes (Signed)
Office Visit Note   Patient: Richard Richmond           Date of Birth: [birth year]           MRN: [id number] Visit Date: 12/10/2020              Requested by: Rema Fendt, NP [address] [place],  [place] [address] PCP: Rema Fendt, NP   Assessment & Plan: Visit Diagnoses:  1. Chest pain, unspecified type     Plan: Impression is left sided chest, rib and parascapular pain.  At this point, his thoracic spine x-rays are unremarkable.  I do not believe that this is a musculoskeletal problem and have discussed with the patient that he needs to get in contact with his primary care provider as soon as he leaves this appointment.  If they are unable to see him today, I recommended going to the ED for further evaluation and treat recommendation.  Follow-Up Instructions: Return if symptoms worsen or fail to improve.   Orders:  Orders Placed This Encounter  Procedures   XR Thoracic Spine 2 View   No orders of the defined types were placed in this encounter.     Procedures: No procedures performed   Clinical Data: No additional findings.   Subjective: Chief Complaint  Patient presents with   Chest - Pain    HPI patient is a pleasant 25 year old male who comes in today with left-sided chest pain for the past 2 months.  He initially began having chest pain and was diagnosed with myopericarditis about a year and a half ago after completing his first COVID-19 vaccine.  This recent pain has primarily been to the left side of the chest, ribs and into the left parascapular region and occasionally down his left arm.  He notes that this occurs intermittent, but on a daily basis.  Pain is worse lying on either side as well is when he occasionally takes a deep breath.  No current shortness of breath.  He does not take medication for this.  He does have a history of acid reflux but is currently not medicated.  He also notes that he had COVID about a month and a half ago but  the symptoms started prior to this illness.  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  No history of anxiety.  No family history of heart issues.  He notes that he is recently talked to his PCP and was told that he should come here for further evaluation.  Review of Systems as detailed in HPI.  All others reviewed and are negative.   Objective: Vital Signs: Vital signs: Blood pressure 97/61, pulse 72 bpm, O2 saturation on room air 98%, oral temperature 98.4 F.  Physical Exam well-developed well-nourished gentleman in no acute distress.  Alert and oriented x3.  Ortho Exam examination reveals mild and diffuse tenderness throughout the left chest wall and into the mid thoracic spine radiating into the parascapular region.  No pain with movement of the neck, shoulder or thoracic spine.  No skin changes.  He is neurovascularly intact distally.  Specialty Comments:  No specialty comments available.  Imaging: XR Thoracic Spine 2 View  Result Date: 12/10/2020 No acute or structural abnormalities    PMFS History: Patient Active Problem List   Diagnosis Date Noted   Other adverse food reactions, not elsewhere classified, subsequent encounter 08/09/2020   Rash and other nonspecific skin  eruption 08/09/2020   Heartburn 08/09/2020   Gastrointestinal symptoms 08/09/2020   Myopericarditis 10/29/2019   Elevated liver function tests 10/29/2019   Thrombocytopenia - transient, resolved 10/29/2019   Past Medical History:  Diagnosis Date   Abnormal liver function test    in context of myopericarditis   GERD (gastroesophageal reflux disease)    Phreesia 06/05/2020   Myopericarditis     Family History  Problem Relation Age of Onset   Healthy Mother    Healthy Father    Colon polyps Maternal Grandmother    Colon cancer Neg Hx    Esophageal cancer Neg Hx    Rectal cancer Neg Hx    Stomach cancer Neg Hx     Past Surgical History:  Procedure Laterality  Date   NO PAST SURGERIES     Social History   Occupational History   Not on file  Tobacco Use   Smoking status: Former   Smokeless tobacco: Never  Vaping Use   Vaping Use: Never used  Substance and Sexual Activity   Alcohol use: Not Currently    Comment: Stopped 08/2019   Drug use: Never   Sexual activity: Yes    Birth control/protection: Condom

## 2021-01-25 NOTE — Progress Notes (Addendum)
NEUROLOGY FOLLOW UP OFFICE NOTE  Richard Richmond [id number]  Assessment/Plan:   Left upper extremity pain and numbness - unclear etiology.  Gabapentin not effective  Will check MRI of cervical spine to evaluate for etiology.  Further recommendations pending results.  ADDENDUM:  MRI of cervical spine is normal.  I have no neurologic explanation for his symptoms.  Advise to follow up with PCP Richard Millet, DO  Subjective:  Richard Richmond is a 25 year old right-handed male with myopericarditis and GERD who follows up for left arm pain.  UPDATE:  He has continued to have chest and rib pain.  He followed up with orthopedics.  Cervical X-ray on 09/06/2020 showed no significant arthropathy or acute fractures.  Thoracic X-ray was unremarkable.  He did not think this was a musculoskeletal issue.  NCV-EMG left upper extremity from 09/29/2020 was normal.  He is seeing gastroenterology.  He may have GERD.  Gabapentin ineffective.   HISTORY: For a year and a half, he has had some mild left arm pain.  He was diagnosed with myopericarditis in September 2021.  Since then, he has been experiencing recurrent left upper extremity pain as well as left sided chest and rib pain.  He describes a sharp pain from the left elbow up to the left shoulder and between the shoulder blades.  He also may have a diffuse aching pain of the left hand and "creeping pain" in the forearm.  He also may notices pain in the left side of his neck and trapezius.  He denies weakness, numbness or paresthesias.  He particularly notices it in the morning waking up in bed or after a long day working (he is a Conservation officer, nature at [employer]).  He has very mild symptoms in the right upper extremity was well.  He has been to the ED on several occasions.  CTA of chest and multiple CXRs have been normal.  He is followed by cardiology.  Cardiac workup was unremarkable and chest pain not felt to be cardiac-related.    PAST MEDICAL HISTORY: Past Medical  History:  Diagnosis Date   Abnormal liver function test    in context of myopericarditis   GERD (gastroesophageal reflux disease)    Phreesia 06/05/2020   Myopericarditis     MEDICATIONS: Current Outpatient Medications on File Prior to Visit  Medication Sig Dispense Refill   Ascorbic Acid (VITAMIN C PO) Take 1 tablet by mouth daily.     ibuprofen (ADVIL) 200 MG tablet Take 400 mg by mouth every 6 (six) hours as needed for headache or mild pain.     omeprazole (PRILOSEC) 20 MG capsule Take 1 capsule (20 mg total) by mouth daily. (Patient taking differently: Take 20 mg by mouth daily as needed (acid reflux).) 60 capsule 0   VITAMIN D PO Take 1 tablet by mouth daily.     No current facility-administered medications on file prior to visit.    ALLERGIES: Allergies  Allergen Reactions   Milk-Related Compounds    Other     Tree nuts and shellfish    FAMILY HISTORY: Family History  Problem Relation Age of Onset   Healthy Mother    Healthy Father    Colon polyps Maternal Grandmother    Colon cancer Neg Hx    Esophageal cancer Neg Hx    Rectal cancer Neg Hx    Stomach cancer Neg Hx       Objective:  Blood pressure 105/70, pulse 89, height 5\' 6"  (1.676 m),  weight 120 lb 3.2 oz (54.5 kg), SpO2 98 %. General: No acute distress.  Patient appears well-groomed.   Head:  Normocephalic/atraumatic Eyes:  Fundi examined but not visualized Neck: supple, no paraspinal tenderness, full range of motion Heart:  Regular rate and rhythm Lungs:  Clear to auscultation bilaterally Back: No paraspinal tenderness Neurological Exam: alert and oriented to person, place, and time.  Speech fluent and not dysarthric, language intact.  CN II-XII intact. Bulk and tone normal, muscle strength 5/5 throughout.  Sensation to light touch intact.  Deep tendon reflexes 2+ throughout, toes downgoing.  Finger to nose testing intact.  Gait normal, Romberg negative.   Richard Millet, DO  CC: Richard Stabs,  NP

## 2021-01-26 ENCOUNTER — Other Ambulatory Visit: Payer: Self-pay

## 2021-01-26 ENCOUNTER — Encounter: Payer: Self-pay | Admitting: Neurology

## 2021-01-26 ENCOUNTER — Ambulatory Visit (INDEPENDENT_AMBULATORY_CARE_PROVIDER_SITE_OTHER): Payer: 59 | Admitting: Neurology

## 2021-01-26 VITALS — BP 105/70 | HR 89 | Ht 66.0 in | Wt 120.2 lb

## 2021-01-26 DIAGNOSIS — M542 Cervicalgia: Secondary | ICD-10-CM | POA: Diagnosis not present

## 2021-01-26 DIAGNOSIS — R200 Anesthesia of skin: Secondary | ICD-10-CM | POA: Diagnosis not present

## 2021-01-26 DIAGNOSIS — M79602 Pain in left arm: Secondary | ICD-10-CM | POA: Diagnosis not present

## 2021-01-26 NOTE — Patient Instructions (Addendum)
Will check MRI of cervical spine.  Further recommendations pending results. A referral to [facility] has been placed for your MRI someone will contact you directly to schedule your appt. They are located at [address] [facility]. Please contact them directly by calling [phone] with any questions regarding your referral.

## 2021-01-28 ENCOUNTER — Ambulatory Visit (INDEPENDENT_AMBULATORY_CARE_PROVIDER_SITE_OTHER): Payer: 59 | Admitting: Gastroenterology

## 2021-01-28 ENCOUNTER — Encounter: Payer: Self-pay | Admitting: Gastroenterology

## 2021-01-28 VITALS — BP 110/50 | HR 95 | Ht 66.0 in | Wt 121.2 lb

## 2021-01-28 DIAGNOSIS — K219 Gastro-esophageal reflux disease without esophagitis: Secondary | ICD-10-CM

## 2021-01-28 NOTE — Progress Notes (Signed)
HPI : Richard Richmond is a very pleasant 25 year old male who I have seen previously for suspected GERD symptoms, initially in July of this year.  A subsequent EGD showed an irregular Z line but was otherwise normal, including normal gastric biopsies. He had been doing well at a follow up visit in August and it was recommended that he discontinue his PPI.  His chest pain returned about a month ago.  He describes his pain as a pinching sensation in his epigastrium, sometimes a burning sensation.  These pains last up to 30 minutes, and can come and go over a few hours. He also describes a pressure sensation he gets in his chest, which is different and usually only lasts a few minutes.  He has been having these symptoms on a daily basis.  Sometimes he is awakened from sleep with the chest pain.  He started taking omeprazole again, and feels that this has helped, but has not completely eliminated his symptoms. He admits his diet has changed a little, eating out about once a week, but still mostly eating homecooked meals   Past Medical History:  Diagnosis Date   Abnormal liver function test    in context of myopericarditis   GERD (gastroesophageal reflux disease)    Phreesia 06/05/2020   Myopericarditis      Past Surgical History:  Procedure Laterality Date   NO PAST SURGERIES     Family History  Problem Relation Age of Onset   Healthy Mother    Healthy Father    Colon polyps Maternal Grandmother    Colon cancer Neg Hx    Esophageal cancer Neg Hx    Rectal cancer Neg Hx    Stomach cancer Neg Hx    Social History   Tobacco Use   Smoking status: Former   Smokeless tobacco: Never  Vaping Use   Vaping Use: Never used  Substance Use Topics   Alcohol use: Not Currently    Comment: Stopped 08/2019   Drug use: Never   Current Outpatient Medications  Medication Sig Dispense Refill   ibuprofen (ADVIL) 200 MG tablet Take 400 mg by mouth every 6 (six) hours as needed for headache or mild  pain.     omeprazole (PRILOSEC) 20 MG capsule Take 1 capsule (20 mg total) by mouth daily. (Patient taking differently: Take 20 mg by mouth daily as needed (acid reflux).) 60 capsule 0   No current facility-administered medications for this visit.   Allergies  Allergen Reactions   Milk-Related Compounds    Other     Tree nuts and shellfish     Review of Systems: All systems reviewed and negative except where noted in HPI.    No results found.  Physical Exam: BP (!) 110/50   Pulse 95   Ht 5\' 6"  (1.676 m)   Wt 121 lb 4 oz (55 kg)   BMI 19.57 kg/m  Constitutional: Pleasant,well-developed, Caucasian male in no acute distress. HEENT: Normocephalic and atraumatic. Conjunctivae are normal. No scleral icterus. Cardiovascular: Normal rate, regular rhythm.  Pulmonary/chest: Effort normal and breath sounds normal. No wheezing, rales or rhonchi. Abdominal: Soft, nondistended, nontender. Bowel sounds active throughout. There are no masses palpable. No hepatomegaly. Extremities: no edema Neurological: Alert and oriented to person place and time. Skin: Skin is warm and dry. No rashes noted. Psychiatric: Normal mood and affect. Behavior is normal.  CBC    Component Value Date/Time   WBC 4.0 10/19/2020 1527   RBC 4.86 10/19/2020  1527   HGB 13.6 10/19/2020 1527   HCT 41.0 10/19/2020 1527   PLT 153 10/19/2020 1527   MCV 84.4 10/19/2020 1527   MCH 28.0 10/19/2020 1527   MCHC 33.2 10/19/2020 1527   RDW 12.4 10/19/2020 1527   LYMPHSABS 1.0 10/19/2020 1527   MONOABS 0.6 10/19/2020 1527   EOSABS 0.0 10/19/2020 1527   BASOSABS 0.0 10/19/2020 1527    CMP     Component Value Date/Time   NA 138 10/19/2020 1527   NA 141 12/08/2019 1146   K 4.0 10/19/2020 1527   CL 100 10/19/2020 1527   CO2 29 10/19/2020 1527   GLUCOSE 86 10/19/2020 1527   BUN 19 10/19/2020 1527   BUN 14 12/08/2019 1146   CREATININE 0.80 10/19/2020 1527   CALCIUM 9.8 10/19/2020 1527   PROT 7.5 10/19/2020 1527    PROT 7.6 12/08/2019 1146   ALBUMIN 4.4 10/19/2020 1527   ALBUMIN 5.0 12/08/2019 1146   AST 28 10/19/2020 1527   ALT 20 10/19/2020 1527   ALKPHOS 60 10/19/2020 1527   BILITOT 0.7 10/19/2020 1527   BILITOT 0.3 12/08/2019 1146   GFRNONAA >60 10/19/2020 1527   GFRAA 141 12/08/2019 1146     ASSESSMENT AND PLAN: 25 year old with chronic GERD symptoms and atypical chest pain, partially responsive to PPI with EGD not demonstrative of overt evidence of GERD.  Given his persistent symptoms, incomplete PPI response and equivocal EGD findings, I recommended we pursue pH/impedance testing OFF PPI to further clarify the role of acid/non-acid reflux in his symptoms.  The details, benefits and risks of the procedure were discussed and he agreed to proceed.  GERD - pH/impedance OFF PPI - Continue Prilosec for now, but pt will need to stop prior to impedance testing  Glender Augusta E. Tomasa Rand, MD [facility]   CC:  Rema Fendt, NP

## 2021-01-28 NOTE — Patient Instructions (Signed)
If you are age 26 or older, your body mass index should be between 23-30. Your Body mass index is 19.57 kg/m. If this is out of the aforementioned range listed, please consider follow up with your Primary Care Provider.  If you are age 39 or younger, your body mass index should be between 19-25. Your Body mass index is 19.57 kg/m. If this is out of the aformentioned range listed, please consider follow up with your Primary Care Provider.   You have been scheduled for an esophageal manometry test at [facility] on 05/04/21 at 8:30am. Please arrive 30 minutes prior to your procedure for registration. You will need to go to outpatient registration (1st floor of the hospital) first. Make certain to bring your insurance cards as well as a complete list of medications.  Please remember the following:  1) Do not take any muscle relaxants, xanax (alprazolam) or ativan for 1 day prior to your test as well as the day of the test.  2) Nothing to eat or drink after 12:00 midnight on the night before your test.  3) Hold all diabetic medications/insulin the morning of the test. You may eat and take your medications after the test.  It will take at least 2 weeks to receive the results of this test from your physician.  ------------------------------------------ ABOUT ESOPHAGEAL MANOMETRY Esophageal manometry (muh-NOM-uh-tree) is a test that gauges how well your esophagus works. Your esophagus is the long, muscular tube that connects your throat to your stomach. Esophageal manometry measures the rhythmic muscle contractions (peristalsis) that occur in your esophagus when you swallow. Esophageal manometry also measures the coordination and force exerted by the muscles of your esophagus.  During esophageal manometry, a thin, flexible tube (catheter) that contains sensors is passed through your nose, down your esophagus and into your stomach. Esophageal manometry can be helpful in diagnosing some mostly  uncommon disorders that affect your esophagus.  Why it's done Esophageal manometry is used to evaluate the movement (motility) of food through the esophagus and into the stomach. The test measures how well the circular bands of muscle (sphincters) at the top and bottom of your esophagus open and close, as well as the pressure, strength and pattern of the wave of esophageal muscle contractions that moves food along.  What you can expect Esophageal manometry is an outpatient procedure done without sedation. Most people tolerate it well. You may be asked to change into a hospital gown before the test starts.  During esophageal manometry  While you are sitting up, a member of your health care team sprays your throat with a numbing medication or puts numbing gel in your nose or both.  A catheter is guided through your nose into your esophagus. The catheter may be sheathed in a water-filled sleeve. It doesn't interfere with your breathing. However, your eyes may water, and you may gag. You may have a slight nosebleed from irritation.  After the catheter is in place, you may be asked to lie on your back on an exam table, or you may be asked to remain seated.  You then swallow small sips of water. As you do, a computer connected to the catheter records the pressure, strength and pattern of your esophageal muscle contractions.  During the test, you'll be asked to breathe slowly and smoothly, remain as still as possible, and swallow only when you're asked to do so.  A member of your health care team may move the catheter down into your stomach while  the catheter continues its measurements.  The catheter then is slowly withdrawn. The test usually lasts 20 to 30 minutes.  After esophageal manometry  When your esophageal manometry is complete, you may return to your normal activities  This test typically takes 30-45 minutes to complete.  The [place] [facility] providers would like to encourage you to use [facility] to  communicate with providers for non-urgent requests or questions.  Due to long hold times on the telephone, sending your provider a message by [facility] may be a faster and more efficient way to get a response.  Please allow 48 business hours for a response.  Please remember that this is for non-urgent requests.   It was a pleasure to see you today!  Thank you for trusting me with your gastrointestinal care!    Scott E.Tomasa Rand, MD

## 2021-02-01 ENCOUNTER — Other Ambulatory Visit: Payer: Self-pay

## 2021-02-01 ENCOUNTER — Encounter: Payer: Self-pay | Admitting: Gastroenterology

## 2021-02-01 DIAGNOSIS — R079 Chest pain, unspecified: Secondary | ICD-10-CM

## 2021-02-07 NOTE — Progress Notes (Signed)
This encounter was created in error - please disregard.

## 2021-02-11 ENCOUNTER — Ambulatory Visit: Payer: 59 | Admitting: Neurology

## 2021-02-22 ENCOUNTER — Encounter: Payer: Self-pay | Admitting: *Deleted

## 2021-02-25 ENCOUNTER — Ambulatory Visit
Admission: RE | Admit: 2021-02-25 | Discharge: 2021-02-25 | Disposition: A | Discharge status: Home / Self Care | Payer: No Typology Code available for payment source | Source: Ambulatory Visit | Attending: Neurology | Admitting: Neurology

## 2021-02-25 DIAGNOSIS — M542 Cervicalgia: Secondary | ICD-10-CM

## 2021-02-25 DIAGNOSIS — M79602 Pain in left arm: Secondary | ICD-10-CM

## 2021-02-25 IMAGING — MR MR CERVICAL SPINE W/O CM
5 series · 35 of 48 positions shown · non-contrast
Comparison: Cervical spine radiographs [DATE]

CLINICAL DATA: Left arm and neck pain

EXAM:
MRI CERVICAL SPINE WITHOUT CONTRAST
TECHNIQUE: Multiplanar, multisequence MR imaging of the cervical spine was
performed. No intravenous contrast was administered.

[Series 2: T2 · sagittal · 3.0mm · 0.45mm/px · 8 of 15 slices shown (1 of 2)]
[im 1/15]
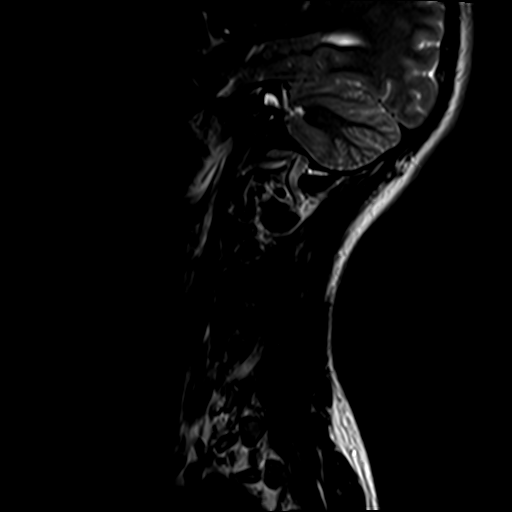
[im 3/15]
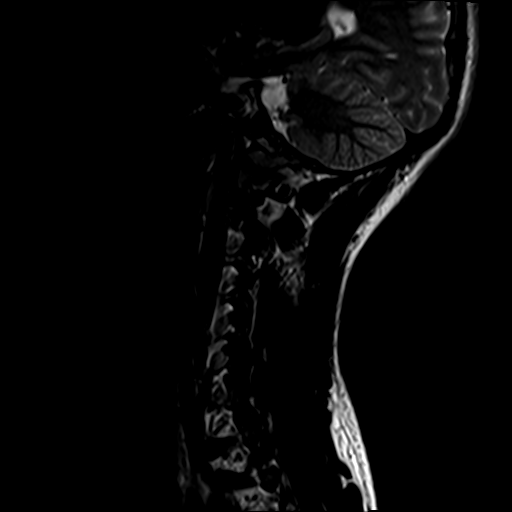
[im 5/15]
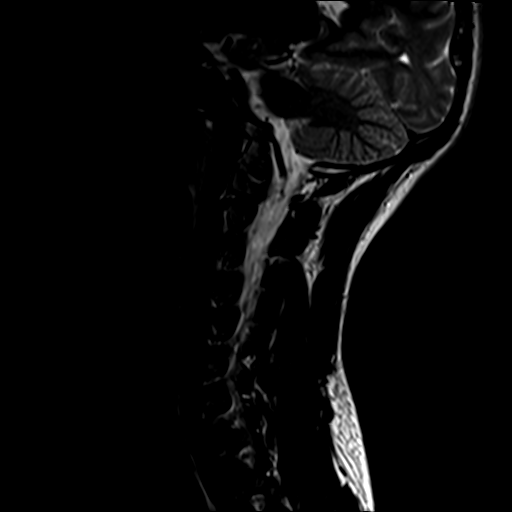
[im 7/15]
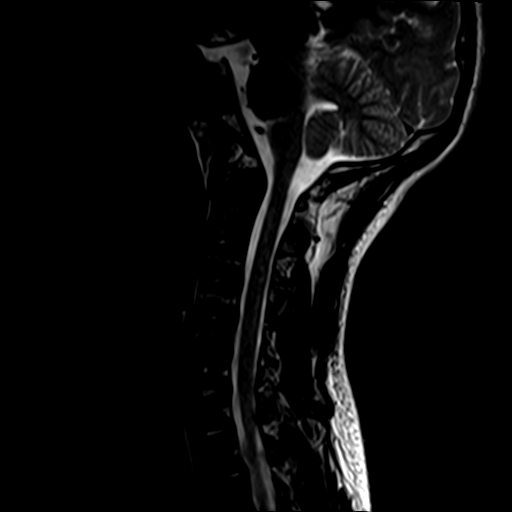
[im 9/15]
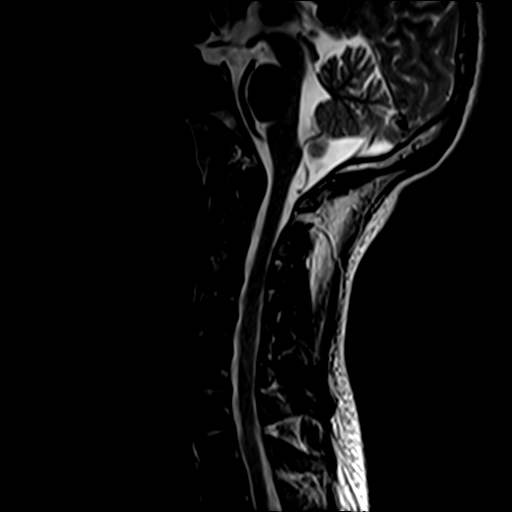
[im 11/15]
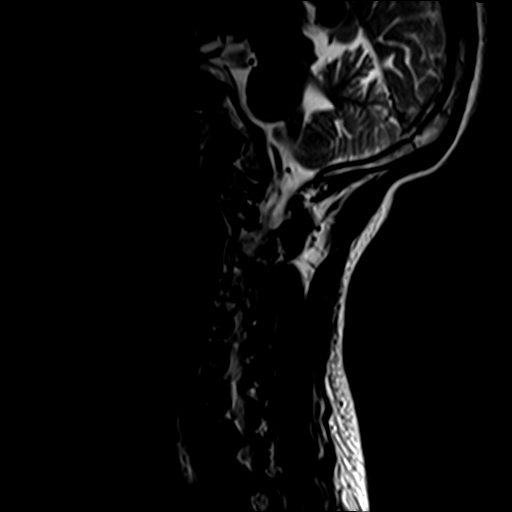
[im 13/15]
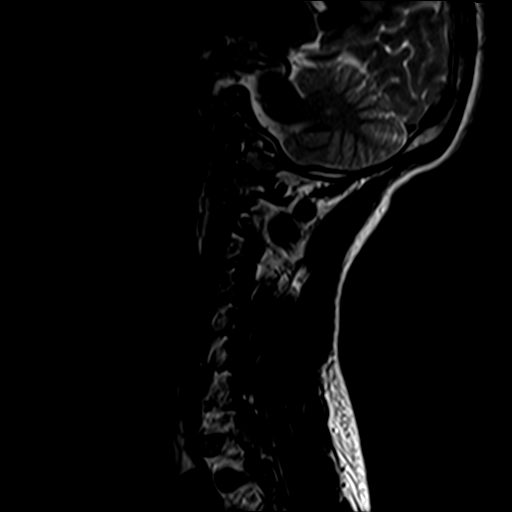
[im 15/15]
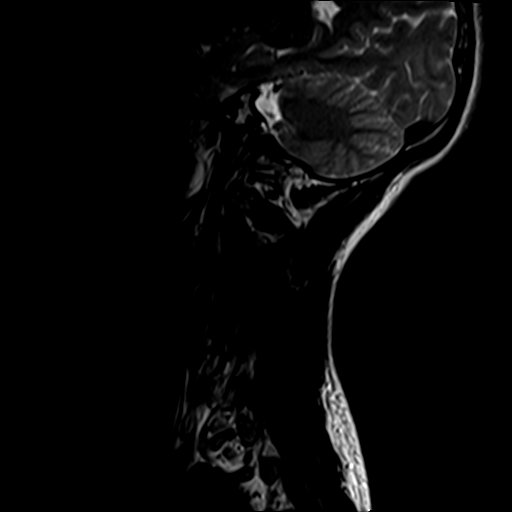

[Series 3: STIR · sagittal · 3.0mm · 0.90mm/px · 7 of 15 slices shown]
[im 1/15]
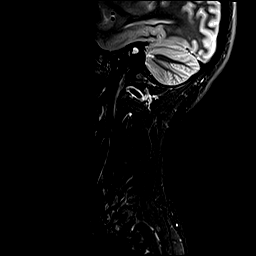
[im 3/15]
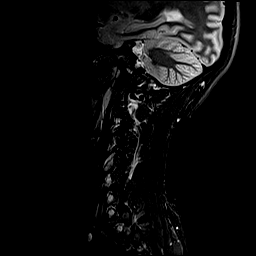
[im 5/15]
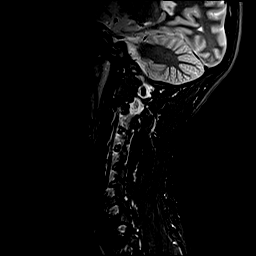
[im 8/15]
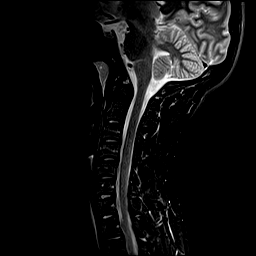
[im 10/15]
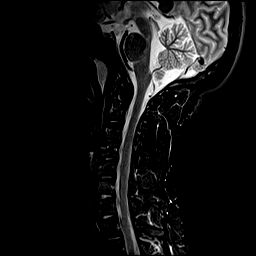
[im 12/15]
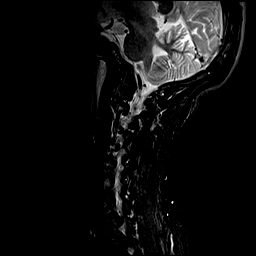
[im 15/15]
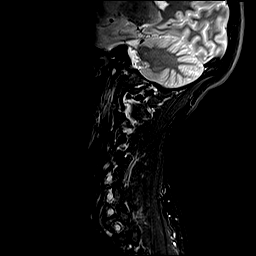

[Series 4: T1 · sagittal · 3.0mm · 0.90mm/px · 7 of 15 slices shown]
[im 1/15]
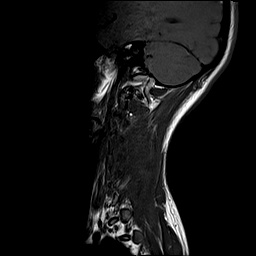
[im 3/15]
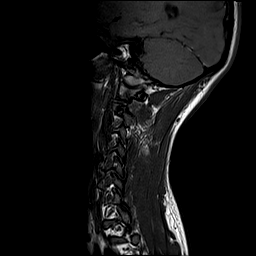
[im 5/15]
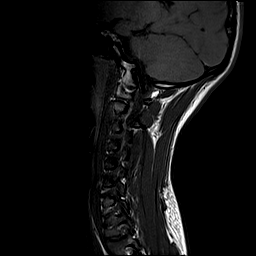
[im 8/15]
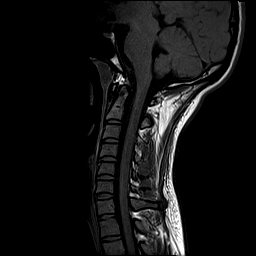
[im 10/15]
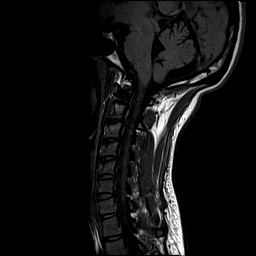
[im 12/15]
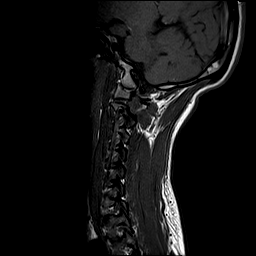
[im 15/15]
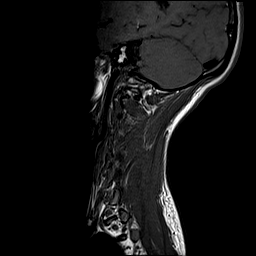

[Series 5: T2 · axial · 3.0mm · 0.70mm/px · z∈[-84,+16]mm · 9 of 28 slices shown (2 of 2)]
[im 1/28]
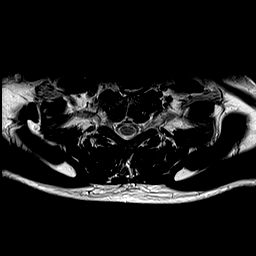
[im 5/28]
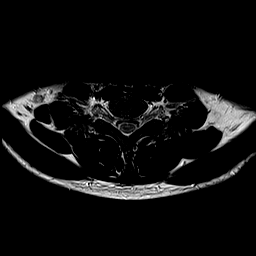
[im 10/28]
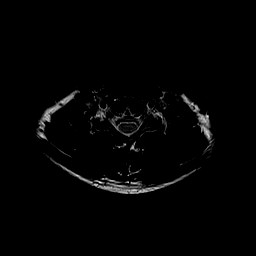
[im 12/28]
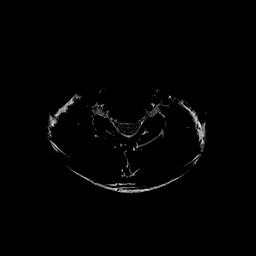
[im 14/28]
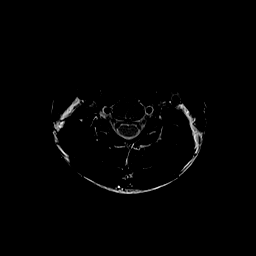
[im 16/28]
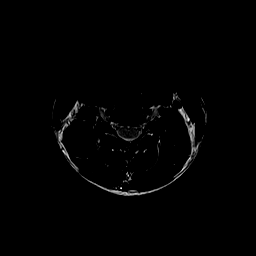
[im 19/28]
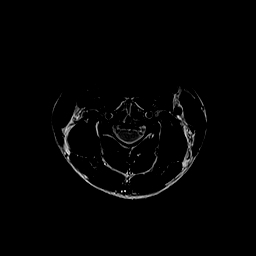
[im 23/28]
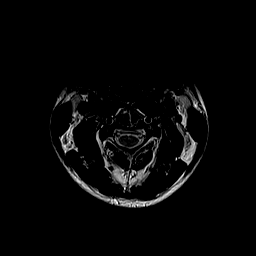
[im 28/28]
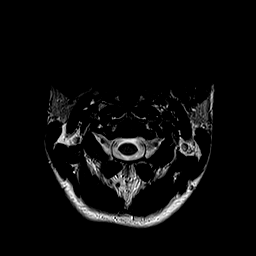

[Series 6: GRE · axial · 3.0mm · 0.35mm/px · z∈[-84,-44]mm · 4 of 28 slices shown]
[im 1/28]
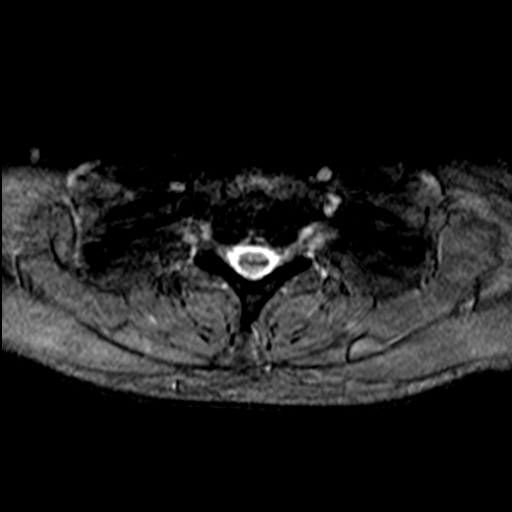
[im 5/28]
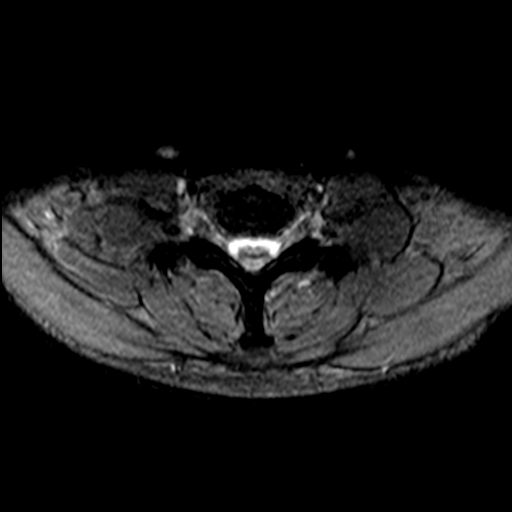
[im 10/28]
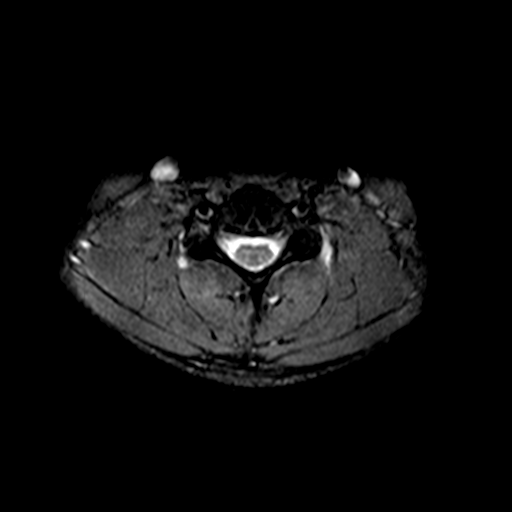
[im 12/28]
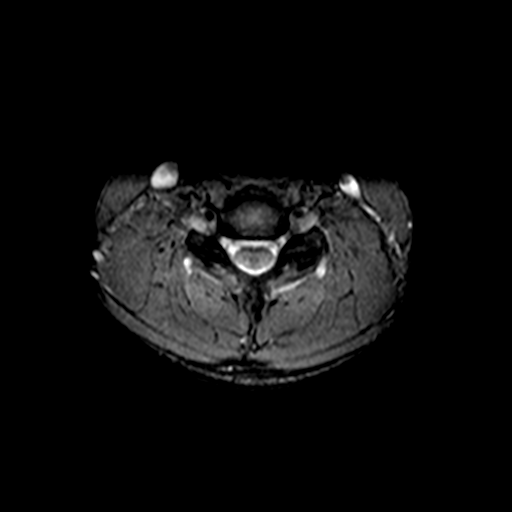

[35 of 48 positions shown; findings below may reference images not displayed]

FINDINGS: Alignment: Normal.

Vertebrae: Vertebral body heights are preserved. Marrow signal is
normal.

Cord: Normal in signal and morphology.

Posterior Fossa, vertebral arteries, paraspinal tissues: The imaged
posterior fossa is unremarkable. The vertebral artery flow voids are
present. The paraspinal soft tissues are unremarkable.

Disc levels:

The disc spaces are preserved. There is no significant disc
herniation or spinal canal stenosis. There is no significant
uncovertebral or facet arthropathy, and no significant neural
foraminal stenosis.
IMPRESSION: Normal cervical spine MRI. No significant spinal canal or neural
foraminal stenosis, or other finding to explain the patient's
symptoms.

## 2021-04-26 ENCOUNTER — Encounter (HOSPITAL_COMMUNITY): Payer: Self-pay | Admitting: Gastroenterology

## 2021-05-03 NOTE — Progress Notes (Signed)
Called patient and was going over instructions for mano/ph. Dr. Tomasa Rand had written that he wanted patient off the PPI for the ph test. Patient did not know this and has not stopped the PPI. Talked with Bonita Quin , Dr. Tomasa Rand 's nurse and she agreed that we should reschedule the test so that patient could be off the PPI. Rescheduled test for June as the schedule did not allow any sooner. I told patient that I would look out for any cancellations to be able to put him on.  ?

## 2021-08-17 ENCOUNTER — Ambulatory Visit (HOSPITAL_COMMUNITY)
Admission: RE | Admit: 2021-08-17 | Discharge: 2021-08-17 | Disposition: A | Discharge status: Home / Self Care | Payer: BC Managed Care – PPO | Attending: Gastroenterology | Admitting: Gastroenterology

## 2021-08-17 ENCOUNTER — Encounter (HOSPITAL_COMMUNITY): Payer: Self-pay | Admitting: Gastroenterology

## 2021-08-17 ENCOUNTER — Encounter (HOSPITAL_COMMUNITY)
Admission: RE | Disposition: A | Discharge status: Home / Self Care | Payer: Self-pay | Source: Home / Self Care | Attending: Gastroenterology

## 2021-08-17 DIAGNOSIS — K219 Gastro-esophageal reflux disease without esophagitis: Secondary | ICD-10-CM | POA: Diagnosis not present

## 2021-08-17 DIAGNOSIS — K224 Dyskinesia of esophagus: Secondary | ICD-10-CM | POA: Insufficient documentation

## 2021-08-17 DIAGNOSIS — R0789 Other chest pain: Secondary | ICD-10-CM

## 2021-08-17 DIAGNOSIS — R109 Unspecified abdominal pain: Secondary | ICD-10-CM | POA: Insufficient documentation

## 2021-08-17 HISTORY — PX: PH IMPEDANCE STUDY: SHX5565

## 2021-08-17 HISTORY — PX: ESOPHAGEAL MANOMETRY: SHX5429

## 2021-08-17 SURGERY — MANOMETRY, ESOPHAGUS

## 2021-08-17 MED ORDER — LIDOCAINE VISCOUS HCL 2 % MT SOLN
OROMUCOSAL | Status: AC
  Filled 2021-08-17: qty 15

## 2021-08-17 SURGICAL SUPPLY — 2 items
FACESHIELD LNG OPTICON STERILE (SAFETY) IMPLANT
GLOVE BIO SURGEON STRL SZ8 (GLOVE) ×6 IMPLANT

## 2021-08-17 NOTE — Progress Notes (Signed)
Esophageal Manometry done per protocol. Pt tolerated well with out complication. Ph with impedance done per protocol. Pt tolerated well. Instructions given regarding the study and monitor. Pt verbalized understand and return demonstrated use of monitor. Pt will return tomorrow to have probe removed and monitor downloaded.  

## 2021-09-05 NOTE — Progress Notes (Signed)
Ethelene Browns, The results from your pH/impedance test show that you have normal amounts of acid reflux and esophageal acid exposure.  Additionally, your symptoms of chest pain did not correlate well with the episodes of acid reflux. Your esophageal manometry test did not show any evidence of esophageal spasm, which can be a cause of chest pain.  You had normal peristalsis when you swallow, but some of your swallows were weak or ineffective.  This would not cause chest pain, but in some cases could cause symptoms of difficulty swallowing.  There is no specific therapy recommended or indicated for this finding.  Your chest pain is very unlikely to be related to your esophagus.  I would suggest following up with primary care or myself in clinic to discuss further ways to manage your symptoms.
